# Patient Record
Sex: Male | Born: 1940
Health system: Southern US, Community
[De-identification: ages and names within clinical notes are randomized; demographics above are authoritative.]

## PROBLEM LIST (undated history)

## (undated) DIAGNOSIS — I10 Essential (primary) hypertension: Secondary | ICD-10-CM

## (undated) DIAGNOSIS — I739 Peripheral vascular disease, unspecified: Secondary | ICD-10-CM

## (undated) DIAGNOSIS — E785 Hyperlipidemia, unspecified: Secondary | ICD-10-CM

## (undated) HISTORY — DX: Hyperlipidemia, unspecified: E78.5

## (undated) HISTORY — DX: Peripheral vascular disease, unspecified: I73.9

## (undated) HISTORY — PX: VASECTOMY: SHX75

## (undated) HISTORY — PX: HERNIA REPAIR: SHX51

## (undated) HISTORY — DX: Essential (primary) hypertension: I10

---

## 2010-11-02 ENCOUNTER — Encounter: Admission: RE | Admit: 2010-11-02 | Discharge: 2010-11-02 | Payer: Self-pay | Admitting: Family Medicine

## 2015-02-10 DIAGNOSIS — I10 Essential (primary) hypertension: Secondary | ICD-10-CM | POA: Diagnosis not present

## 2015-02-10 DIAGNOSIS — Z125 Encounter for screening for malignant neoplasm of prostate: Secondary | ICD-10-CM | POA: Diagnosis not present

## 2015-02-13 DIAGNOSIS — Z86010 Personal history of colonic polyps: Secondary | ICD-10-CM | POA: Diagnosis not present

## 2015-02-13 DIAGNOSIS — Z0000 Encounter for general adult medical examination without abnormal findings: Secondary | ICD-10-CM | POA: Diagnosis not present

## 2015-02-13 DIAGNOSIS — I10 Essential (primary) hypertension: Secondary | ICD-10-CM | POA: Diagnosis not present

## 2015-02-13 DIAGNOSIS — F17200 Nicotine dependence, unspecified, uncomplicated: Secondary | ICD-10-CM | POA: Diagnosis not present

## 2015-02-13 DIAGNOSIS — H47091 Other disorders of optic nerve, not elsewhere classified, right eye: Secondary | ICD-10-CM | POA: Diagnosis not present

## 2015-02-13 DIAGNOSIS — R7301 Impaired fasting glucose: Secondary | ICD-10-CM | POA: Diagnosis not present

## 2015-02-13 DIAGNOSIS — N400 Enlarged prostate without lower urinary tract symptoms: Secondary | ICD-10-CM | POA: Diagnosis not present

## 2015-02-13 DIAGNOSIS — N529 Male erectile dysfunction, unspecified: Secondary | ICD-10-CM | POA: Diagnosis not present

## 2015-02-24 DIAGNOSIS — L570 Actinic keratosis: Secondary | ICD-10-CM | POA: Diagnosis not present

## 2015-02-24 DIAGNOSIS — L259 Unspecified contact dermatitis, unspecified cause: Secondary | ICD-10-CM | POA: Diagnosis not present

## 2015-02-24 DIAGNOSIS — D225 Melanocytic nevi of trunk: Secondary | ICD-10-CM | POA: Diagnosis not present

## 2015-04-14 DIAGNOSIS — D125 Benign neoplasm of sigmoid colon: Secondary | ICD-10-CM | POA: Diagnosis not present

## 2015-04-14 DIAGNOSIS — K635 Polyp of colon: Secondary | ICD-10-CM | POA: Diagnosis not present

## 2015-04-14 DIAGNOSIS — Z86010 Personal history of colonic polyps: Secondary | ICD-10-CM | POA: Diagnosis not present

## 2015-04-14 DIAGNOSIS — Z09 Encounter for follow-up examination after completed treatment for conditions other than malignant neoplasm: Secondary | ICD-10-CM | POA: Diagnosis not present

## 2015-04-14 DIAGNOSIS — K5730 Diverticulosis of large intestine without perforation or abscess without bleeding: Secondary | ICD-10-CM | POA: Diagnosis not present

## 2015-05-26 DIAGNOSIS — H4720 Unspecified optic atrophy: Secondary | ICD-10-CM | POA: Diagnosis not present

## 2016-02-27 DIAGNOSIS — Z86010 Personal history of colonic polyps: Secondary | ICD-10-CM | POA: Diagnosis not present

## 2016-02-27 DIAGNOSIS — R7301 Impaired fasting glucose: Secondary | ICD-10-CM | POA: Diagnosis not present

## 2016-02-27 DIAGNOSIS — N400 Enlarged prostate without lower urinary tract symptoms: Secondary | ICD-10-CM | POA: Diagnosis not present

## 2016-02-27 DIAGNOSIS — Z0000 Encounter for general adult medical examination without abnormal findings: Secondary | ICD-10-CM | POA: Diagnosis not present

## 2016-02-27 DIAGNOSIS — F17200 Nicotine dependence, unspecified, uncomplicated: Secondary | ICD-10-CM | POA: Diagnosis not present

## 2016-02-27 DIAGNOSIS — I10 Essential (primary) hypertension: Secondary | ICD-10-CM | POA: Diagnosis not present

## 2016-02-27 DIAGNOSIS — H47091 Other disorders of optic nerve, not elsewhere classified, right eye: Secondary | ICD-10-CM | POA: Diagnosis not present

## 2016-02-27 DIAGNOSIS — E7800 Pure hypercholesterolemia, unspecified: Secondary | ICD-10-CM | POA: Diagnosis not present

## 2016-02-27 DIAGNOSIS — N529 Male erectile dysfunction, unspecified: Secondary | ICD-10-CM | POA: Diagnosis not present

## 2016-03-01 DIAGNOSIS — Z86010 Personal history of colonic polyps: Secondary | ICD-10-CM | POA: Diagnosis not present

## 2016-03-01 DIAGNOSIS — E7800 Pure hypercholesterolemia, unspecified: Secondary | ICD-10-CM | POA: Diagnosis not present

## 2016-03-01 DIAGNOSIS — R7301 Impaired fasting glucose: Secondary | ICD-10-CM | POA: Diagnosis not present

## 2016-03-01 DIAGNOSIS — Z0000 Encounter for general adult medical examination without abnormal findings: Secondary | ICD-10-CM | POA: Diagnosis not present

## 2016-03-01 DIAGNOSIS — N400 Enlarged prostate without lower urinary tract symptoms: Secondary | ICD-10-CM | POA: Diagnosis not present

## 2016-03-01 DIAGNOSIS — Z125 Encounter for screening for malignant neoplasm of prostate: Secondary | ICD-10-CM | POA: Diagnosis not present

## 2016-03-01 DIAGNOSIS — H47091 Other disorders of optic nerve, not elsewhere classified, right eye: Secondary | ICD-10-CM | POA: Diagnosis not present

## 2016-03-01 DIAGNOSIS — I10 Essential (primary) hypertension: Secondary | ICD-10-CM | POA: Diagnosis not present

## 2016-03-01 DIAGNOSIS — N529 Male erectile dysfunction, unspecified: Secondary | ICD-10-CM | POA: Diagnosis not present

## 2016-07-05 DIAGNOSIS — H2513 Age-related nuclear cataract, bilateral: Secondary | ICD-10-CM | POA: Diagnosis not present

## 2016-07-05 DIAGNOSIS — Z0100 Encounter for examination of eyes and vision without abnormal findings: Secondary | ICD-10-CM | POA: Diagnosis not present

## 2016-07-05 DIAGNOSIS — H35372 Puckering of macula, left eye: Secondary | ICD-10-CM | POA: Diagnosis not present

## 2017-03-25 DIAGNOSIS — Z86010 Personal history of colonic polyps: Secondary | ICD-10-CM | POA: Diagnosis not present

## 2017-03-25 DIAGNOSIS — E7800 Pure hypercholesterolemia, unspecified: Secondary | ICD-10-CM | POA: Diagnosis not present

## 2017-03-25 DIAGNOSIS — Z0000 Encounter for general adult medical examination without abnormal findings: Secondary | ICD-10-CM | POA: Diagnosis not present

## 2017-03-25 DIAGNOSIS — Z125 Encounter for screening for malignant neoplasm of prostate: Secondary | ICD-10-CM | POA: Diagnosis not present

## 2017-03-25 DIAGNOSIS — F17200 Nicotine dependence, unspecified, uncomplicated: Secondary | ICD-10-CM | POA: Diagnosis not present

## 2017-03-25 DIAGNOSIS — L309 Dermatitis, unspecified: Secondary | ICD-10-CM | POA: Diagnosis not present

## 2017-03-25 DIAGNOSIS — I10 Essential (primary) hypertension: Secondary | ICD-10-CM | POA: Diagnosis not present

## 2017-03-25 DIAGNOSIS — R7301 Impaired fasting glucose: Secondary | ICD-10-CM | POA: Diagnosis not present

## 2017-03-25 DIAGNOSIS — N529 Male erectile dysfunction, unspecified: Secondary | ICD-10-CM | POA: Diagnosis not present

## 2017-03-30 ENCOUNTER — Other Ambulatory Visit: Payer: Self-pay | Admitting: Acute Care

## 2017-03-30 DIAGNOSIS — F17210 Nicotine dependence, cigarettes, uncomplicated: Secondary | ICD-10-CM

## 2017-04-18 ENCOUNTER — Encounter: Payer: Self-pay | Admitting: Acute Care

## 2017-04-18 ENCOUNTER — Ambulatory Visit (INDEPENDENT_AMBULATORY_CARE_PROVIDER_SITE_OTHER)
Admission: RE | Admit: 2017-04-18 | Discharge: 2017-04-18 | Disposition: A | Discharge status: Home / Self Care | Payer: Medicare HMO | Source: Ambulatory Visit | Attending: Acute Care | Admitting: Acute Care

## 2017-04-18 ENCOUNTER — Ambulatory Visit (INDEPENDENT_AMBULATORY_CARE_PROVIDER_SITE_OTHER): Payer: Medicare HMO | Admitting: Acute Care

## 2017-04-18 DIAGNOSIS — F17210 Nicotine dependence, cigarettes, uncomplicated: Secondary | ICD-10-CM

## 2017-04-18 DIAGNOSIS — Z87891 Personal history of nicotine dependence: Secondary | ICD-10-CM

## 2017-04-18 NOTE — Progress Notes (Signed)
Shared Decision Making Visit Lung Cancer Screening Program [phone])   Eligibility:  Age 76 y.o.  Pack Years Smoking History Calculation 54-pack-year smoking history (# packs/per year x # years smoked)  Recent History of coughing up blood  no  Unexplained weight loss? no ( >Than 15 pounds within the last 6 months )  Prior History Lung / other cancer no (Diagnosis within the last 5 years already requiring surveillance chest CT Scans).  Smoking Status Current Smoker  Former Smokers: Years since quit: Not applicable  Quit Date: Not applicable  Visit Components:  Discussion included one or more decision making aids. yes  Discussion included risk/benefits of screening. yes  Discussion included potential follow up diagnostic testing for abnormal scans. yes  Discussion included meaning and risk of over diagnosis. yes  Discussion included meaning and risk of False Positives. yes  Discussion included meaning of total radiation exposure. yes  Counseling Included:  Importance of adherence to annual lung cancer LDCT screening. yes  Impact of comorbidities on ability to participate in the program. yes  Ability and willingness to under diagnostic treatment. yes  Smoking Cessation Counseling:  Current Smokers:   Discussed importance of smoking cessation. yes  Information about tobacco cessation classes and interventions provided to patient. yes  Patient provided with "ticket" for LDCT Scan. yes  Symptomatic Patient. no  Counseling not applicable  Diagnosis Code: Tobacco Use Z72.0  Asymptomatic Patient yes  Counseling (Intermediate counseling: > three minutes counseling) A2130  Former Smokers:   Discussed the importance of maintaining cigarette abstinence. yes  Diagnosis Code: Personal History of Nicotine Dependence. Q65.784  Information about tobacco cessation classes and interventions provided to patient. Yes  Patient provided with "ticket" for LDCT Scan.  yes  Written Order for Lung Cancer Screening with LDCT placed in Epic. Yes (CT Chest Lung Cancer Screening Low Dose W/O CM) [id number] Z12.2-Screening of respiratory organs Z87.891-Personal history of nicotine dependence  I have spent 25 minutes of face to face time with Mr. Nuon and his wife discussing the risks and benefits of lung cancer screening. We viewed a power point together that explained in detail the above noted topics. We paused at intervals to allow for questions to be asked and answered to ensure understanding.We discussed that the single most powerful action that he can take to decrease his risk of developing lung cancer is to quit smoking. We discussed whether or not he is ready to commit to setting a quit date. He is currently not ready to set a quit date. We discussed options for tools to aid in quitting smoking including nicotine replacement therapy, non-nicotine medications, support groups, Quit Smart classes, and behavior modification. We discussed that often times setting smaller, more achievable goals, such as eliminating 1 cigarette a day for a week and then 2 cigarettes a day for a week can be helpful in slowly decreasing the number of cigarettes smoked. This allows for a sense of accomplishment as well as providing a clinical benefit. I gave Mr. Jasinski the " Be Stronger Than Your Excuses" card with contact information for community resources, classes, free nicotine replacement therapy, and access to mobile apps, text messaging, and on-line smoking cessation help. I have also given Mr. Hardgrove my card and contact information in the event he needs to contact me. We discussed the time and location of the scan, and that either Doroteo Glassman RN or I will call with the results within 24-48 hours of receiving them. I have offered him  a copy  of the power point we viewed  as a resource in the event they need reinforcement of the concepts we discussed today in the office. The patient verbalized  understanding of all of  the above and had no further questions upon leaving the office. They have my contact information in the event they have any further questions.  I spent for minutes counseling on smoking cessation and the health risks of continued tobacco abuse.  I explained to the patient that there has been a high incidence of coronary artery disease noted on these exams. I explained that this is a non-gated exam therefore degree or severity cannot be determined. This patient is not currently on statin therapy. I have asked the patient to follow-up with their PCP regarding any incidental finding of coronary artery disease and management with diet or medication as their PCP  feels is clinically indicated. The patient verbalized understanding of the above and had no further questions upon completion of the visit.       Magdalen Spatz, NP 04/18/2017

## 2017-04-22 ENCOUNTER — Other Ambulatory Visit: Payer: Self-pay | Admitting: Acute Care

## 2017-04-22 DIAGNOSIS — F17210 Nicotine dependence, cigarettes, uncomplicated: Secondary | ICD-10-CM

## 2017-07-11 DIAGNOSIS — H35372 Puckering of macula, left eye: Secondary | ICD-10-CM | POA: Diagnosis not present

## 2017-07-11 DIAGNOSIS — H524 Presbyopia: Secondary | ICD-10-CM | POA: Diagnosis not present

## 2017-07-11 DIAGNOSIS — H2513 Age-related nuclear cataract, bilateral: Secondary | ICD-10-CM | POA: Diagnosis not present

## 2018-04-28 ENCOUNTER — Ambulatory Visit (INDEPENDENT_AMBULATORY_CARE_PROVIDER_SITE_OTHER)
Admission: RE | Admit: 2018-04-28 | Discharge: 2018-04-28 | Disposition: A | Discharge status: Home / Self Care | Payer: Medicare HMO | Source: Ambulatory Visit | Attending: Acute Care | Admitting: Acute Care

## 2018-04-28 DIAGNOSIS — Z86010 Personal history of colonic polyps: Secondary | ICD-10-CM | POA: Diagnosis not present

## 2018-04-28 DIAGNOSIS — F17200 Nicotine dependence, unspecified, uncomplicated: Secondary | ICD-10-CM | POA: Diagnosis not present

## 2018-04-28 DIAGNOSIS — F17210 Nicotine dependence, cigarettes, uncomplicated: Secondary | ICD-10-CM

## 2018-04-28 DIAGNOSIS — Z125 Encounter for screening for malignant neoplasm of prostate: Secondary | ICD-10-CM | POA: Diagnosis not present

## 2018-04-28 DIAGNOSIS — E7800 Pure hypercholesterolemia, unspecified: Secondary | ICD-10-CM | POA: Diagnosis not present

## 2018-04-28 DIAGNOSIS — Z0000 Encounter for general adult medical examination without abnormal findings: Secondary | ICD-10-CM | POA: Diagnosis not present

## 2018-04-28 DIAGNOSIS — R7301 Impaired fasting glucose: Secondary | ICD-10-CM | POA: Diagnosis not present

## 2018-04-28 DIAGNOSIS — H47091 Other disorders of optic nerve, not elsewhere classified, right eye: Secondary | ICD-10-CM | POA: Diagnosis not present

## 2018-04-28 DIAGNOSIS — R972 Elevated prostate specific antigen [PSA]: Secondary | ICD-10-CM | POA: Diagnosis not present

## 2018-04-28 DIAGNOSIS — I10 Essential (primary) hypertension: Secondary | ICD-10-CM | POA: Diagnosis not present

## 2018-05-02 DIAGNOSIS — M25561 Pain in right knee: Secondary | ICD-10-CM | POA: Diagnosis not present

## 2018-05-02 DIAGNOSIS — W19XXXA Unspecified fall, initial encounter: Secondary | ICD-10-CM | POA: Diagnosis not present

## 2018-05-02 DIAGNOSIS — S8001XA Contusion of right knee, initial encounter: Secondary | ICD-10-CM | POA: Diagnosis not present

## 2018-05-03 ENCOUNTER — Other Ambulatory Visit: Payer: Self-pay | Admitting: Acute Care

## 2018-05-03 DIAGNOSIS — F17210 Nicotine dependence, cigarettes, uncomplicated: Secondary | ICD-10-CM

## 2018-05-03 DIAGNOSIS — Z122 Encounter for screening for malignant neoplasm of respiratory organs: Secondary | ICD-10-CM

## 2018-07-17 DIAGNOSIS — H35372 Puckering of macula, left eye: Secondary | ICD-10-CM | POA: Diagnosis not present

## 2018-07-17 DIAGNOSIS — H524 Presbyopia: Secondary | ICD-10-CM | POA: Diagnosis not present

## 2018-07-17 DIAGNOSIS — H2513 Age-related nuclear cataract, bilateral: Secondary | ICD-10-CM | POA: Diagnosis not present

## 2018-11-06 DIAGNOSIS — R972 Elevated prostate specific antigen [PSA]: Secondary | ICD-10-CM | POA: Diagnosis not present

## 2019-07-27 DIAGNOSIS — R8290 Unspecified abnormal findings in urine: Secondary | ICD-10-CM | POA: Diagnosis not present

## 2019-07-27 DIAGNOSIS — J439 Emphysema, unspecified: Secondary | ICD-10-CM | POA: Diagnosis not present

## 2019-07-27 DIAGNOSIS — H47091 Other disorders of optic nerve, not elsewhere classified, right eye: Secondary | ICD-10-CM | POA: Diagnosis not present

## 2019-07-27 DIAGNOSIS — N400 Enlarged prostate without lower urinary tract symptoms: Secondary | ICD-10-CM | POA: Diagnosis not present

## 2019-07-27 DIAGNOSIS — I10 Essential (primary) hypertension: Secondary | ICD-10-CM | POA: Diagnosis not present

## 2019-07-27 DIAGNOSIS — F17200 Nicotine dependence, unspecified, uncomplicated: Secondary | ICD-10-CM | POA: Diagnosis not present

## 2019-07-27 DIAGNOSIS — E7800 Pure hypercholesterolemia, unspecified: Secondary | ICD-10-CM | POA: Diagnosis not present

## 2019-07-27 DIAGNOSIS — R7301 Impaired fasting glucose: Secondary | ICD-10-CM | POA: Diagnosis not present

## 2019-07-27 DIAGNOSIS — Z13220 Encounter for screening for lipoid disorders: Secondary | ICD-10-CM | POA: Diagnosis not present

## 2019-07-27 DIAGNOSIS — Z0000 Encounter for general adult medical examination without abnormal findings: Secondary | ICD-10-CM | POA: Diagnosis not present

## 2019-09-20 DIAGNOSIS — H2513 Age-related nuclear cataract, bilateral: Secondary | ICD-10-CM | POA: Diagnosis not present

## 2019-09-20 DIAGNOSIS — H35372 Puckering of macula, left eye: Secondary | ICD-10-CM | POA: Diagnosis not present

## 2019-09-20 DIAGNOSIS — H524 Presbyopia: Secondary | ICD-10-CM | POA: Diagnosis not present

## 2019-11-02 DIAGNOSIS — Z23 Encounter for immunization: Secondary | ICD-10-CM | POA: Diagnosis not present

## 2019-11-02 DIAGNOSIS — R799 Abnormal finding of blood chemistry, unspecified: Secondary | ICD-10-CM | POA: Diagnosis not present

## 2020-02-02 ENCOUNTER — Ambulatory Visit: Payer: Medicare HMO | Attending: Internal Medicine

## 2020-02-02 DIAGNOSIS — Z23 Encounter for immunization: Secondary | ICD-10-CM | POA: Insufficient documentation

## 2020-02-02 NOTE — Progress Notes (Signed)
   Covid-19 Vaccination Clinic  Name:  Canden Lownes    MRN: [id number] DOB: [birth year]  02/02/2020  Mr. Seavers was observed post Covid-19 immunization for 15 minutes without incidence. He was provided with Vaccine Information Sheet and instruction to access the V-Safe system.   Mr. Staver was instructed to call 911 with any severe reactions post vaccine: Marland Kitchen Difficulty breathing  . Swelling of your face and throat  . A fast heartbeat  . A bad rash all over your body  . Dizziness and weakness    Immunizations Administered    Name Date Dose VIS Date Route   Pfizer COVID-19 Vaccine 02/02/2020  1:56 PM 0.3 mL 11/23/2019 Intramuscular   Manufacturer: [place]   Lot: [id number]   [place]: [id number]

## 2020-02-26 ENCOUNTER — Ambulatory Visit: Payer: Medicare HMO | Attending: Internal Medicine

## 2020-02-26 DIAGNOSIS — Z23 Encounter for immunization: Secondary | ICD-10-CM

## 2020-02-26 NOTE — Progress Notes (Signed)
   Covid-19 Vaccination Clinic  Name:  Sean Sanders    MRN: [id number] DOB: 04/18/1941  02/26/2020  [name] was observed post Covid-19 immunization for 15 minutes without incident. He was provided with Vaccine Information Sheet and instruction to access the V-Safe system.   [name] was instructed to call 911 with any severe reactions post vaccine: Marland Kitchen Difficulty breathing  . Swelling of face and throat  . A fast heartbeat  . A bad rash all over body  . Dizziness and weakness   Immunizations Administered    Name Date Dose VIS Date Route   Pfizer COVID-19 Vaccine 02/26/2020  1:02 PM 0.3 mL 11/23/2019 Intramuscular   Manufacturer: [place]   Lot: [id number]   [place]: [id number]

## 2020-04-07 DIAGNOSIS — L03115 Cellulitis of right lower limb: Secondary | ICD-10-CM | POA: Diagnosis not present

## 2020-04-10 DIAGNOSIS — L03115 Cellulitis of right lower limb: Secondary | ICD-10-CM | POA: Diagnosis not present

## 2020-04-14 DIAGNOSIS — L0390 Cellulitis, unspecified: Secondary | ICD-10-CM | POA: Diagnosis not present

## 2020-04-14 DIAGNOSIS — Z09 Encounter for follow-up examination after completed treatment for conditions other than malignant neoplasm: Secondary | ICD-10-CM | POA: Diagnosis not present

## 2020-04-21 DIAGNOSIS — L02415 Cutaneous abscess of right lower limb: Secondary | ICD-10-CM | POA: Diagnosis not present

## 2020-04-21 DIAGNOSIS — L03115 Cellulitis of right lower limb: Secondary | ICD-10-CM | POA: Diagnosis not present

## 2020-04-25 DIAGNOSIS — M79604 Pain in right leg: Secondary | ICD-10-CM | POA: Diagnosis not present

## 2020-04-25 DIAGNOSIS — L03115 Cellulitis of right lower limb: Secondary | ICD-10-CM | POA: Diagnosis not present

## 2020-04-25 DIAGNOSIS — L03119 Cellulitis of unspecified part of limb: Secondary | ICD-10-CM | POA: Insufficient documentation

## 2020-05-05 DIAGNOSIS — M79604 Pain in right leg: Secondary | ICD-10-CM | POA: Diagnosis not present

## 2020-05-09 DIAGNOSIS — L03115 Cellulitis of right lower limb: Secondary | ICD-10-CM | POA: Diagnosis not present

## 2020-05-22 DIAGNOSIS — L03119 Cellulitis of unspecified part of limb: Secondary | ICD-10-CM | POA: Diagnosis not present

## 2020-08-14 DIAGNOSIS — E7800 Pure hypercholesterolemia, unspecified: Secondary | ICD-10-CM | POA: Diagnosis not present

## 2020-08-14 DIAGNOSIS — H47091 Other disorders of optic nerve, not elsewhere classified, right eye: Secondary | ICD-10-CM | POA: Diagnosis not present

## 2020-08-14 DIAGNOSIS — F17200 Nicotine dependence, unspecified, uncomplicated: Secondary | ICD-10-CM | POA: Diagnosis not present

## 2020-08-14 DIAGNOSIS — F1010 Alcohol abuse, uncomplicated: Secondary | ICD-10-CM | POA: Diagnosis not present

## 2020-08-14 DIAGNOSIS — Z0000 Encounter for general adult medical examination without abnormal findings: Secondary | ICD-10-CM | POA: Diagnosis not present

## 2020-08-14 DIAGNOSIS — I10 Essential (primary) hypertension: Secondary | ICD-10-CM | POA: Diagnosis not present

## 2020-08-14 DIAGNOSIS — N400 Enlarged prostate without lower urinary tract symptoms: Secondary | ICD-10-CM | POA: Diagnosis not present

## 2020-08-14 DIAGNOSIS — E871 Hypo-osmolality and hyponatremia: Secondary | ICD-10-CM | POA: Diagnosis not present

## 2020-08-14 DIAGNOSIS — I2510 Atherosclerotic heart disease of native coronary artery without angina pectoris: Secondary | ICD-10-CM | POA: Diagnosis not present

## 2020-08-14 DIAGNOSIS — R7301 Impaired fasting glucose: Secondary | ICD-10-CM | POA: Diagnosis not present

## 2020-08-14 DIAGNOSIS — J438 Other emphysema: Secondary | ICD-10-CM | POA: Diagnosis not present

## 2020-08-14 DIAGNOSIS — R8290 Unspecified abnormal findings in urine: Secondary | ICD-10-CM | POA: Diagnosis not present

## 2020-09-14 NOTE — Progress Notes (Signed)
Cardiology Office Note:    Date:  09/16/2020   ID:  Sean Sanders, DOB 11-Apr-1941, MRN [id number]  PCP:  Hulan Fess, MD  Cardiologist:  No primary care provider on file.  Electrophysiologist:  None   Referring MD: Hulan Fess, MD   Chief Complaint  Patient presents with  . Coronary Artery Disease    History of Present Illness:    Sean Sanders is a 79 y.o. male with a hx of hypertension, tobacco use who is referred by Dr. Rex Kras for evaluation of coronary calcification seen on CT. Three-vessel coronary artery calcification seen on CT chest on 04/29/2018.  He denies any chest pain or shortness of breath.  Reports he walks 2 times per week for 30 minutes, denies any exertional symptoms.  He denies any lightheadedness, syncope, palpitations, or lower extremity edema.  He smokes 1 pack/day, started at age 26.  No history of heart disease in his immediate family.  No past medical history on file.  Current Medications: Current Meds  Medication Sig  . amLODipine (NORVASC) 5 MG tablet   . Ascorbic Acid (VITAMIN C WITH ROSE HIPS) 500 MG tablet Take 500 mg by mouth daily.  Marland Kitchen aspirin EC 81 MG tablet Take 81 mg by mouth daily. Swallow whole.  . benazepril (LOTENSIN) 10 MG tablet benazepril 10 mg tablet  . cholecalciferol (VITAMIN D3) 25 MCG (1000 UNIT) tablet Take 1,000 Units by mouth daily.  . cyanocobalamin 100 MCG tablet Take 100 mcg by mouth daily.  . Multiple Vitamin (MULTIVITAMIN) tablet Take 1 tablet by mouth daily.  . Omega-3 Fatty Acids (FISH OIL) 1000 MG CAPS Take by mouth.  . sulfamethoxazole-trimethoprim (BACTRIM DS) 800-160 MG tablet Take 1 tablet by mouth 2 (two) times daily.     Allergies:   Patient has no known allergies.   Social History   Socioeconomic History  . Marital status: Married    Spouse name: Not on file  . Number of children: Not on file  . Years of education: Not on file  . Highest education level: Not on file  Occupational History  . Not on file   Tobacco Use  . Smoking status: Current Every Day Smoker    Packs/day: 1.00    Years: 54.00    Pack years: 54.00    Types: Cigarettes    Start date: 1964  . Smokeless tobacco: Never Used  Substance and Sexual Activity  . Alcohol use: Not on file  . Drug use: Not on file  . Sexual activity: Not on file  Other Topics Concern  . Not on file  Social History Narrative  . Not on file   Social Determinants of Health   Financial Resource Strain:   . Difficulty of Paying Living Expenses: Not on file  Food Insecurity:   . Worried About Charity fundraiser in the Last Year: Not on file  . Ran Out of Food in the Last Year: Not on file  Transportation Needs:   . Lack of Transportation (Medical): Not on file  . Lack of Transportation (Non-Medical): Not on file  Physical Activity:   . Days of Exercise per Week: Not on file  . Minutes of Exercise per Session: Not on file  Stress:   . Feeling of Stress : Not on file  Social Connections:   . Frequency of Communication with Friends and Family: Not on file  . Frequency of Social Gatherings with Friends and Family: Not on file  . Attends Religious Services: Not on  file  . Active Member of Clubs or Organizations: Not on file  . Attends Archivist Meetings: Not on file  . Marital Status: Not on file     Family History: The patient's family history is not on file.  ROS:   Please see the history of present illness.     All other systems reviewed and are negative.  EKGs/Labs/Other Studies Reviewed:    The following studies were reviewed today:   EKG:  EKG is  ordered today.  The ekg ordered today demonstrates normal sinus rhythm, PAC, right axis deviation, poor R wave progression  Recent Labs: No results found for requested labs within last 8760 hours.  Recent Lipid Panel No results found for: CHOL, TRIG, HDL, CHOLHDL, VLDL, LDLCALC, LDLDIRECT  Physical Exam:    VS:  BP 120/66   Pulse 97   Ht 5\' 3"  (1.6 m)   Wt 158  lb (71.7 kg)   SpO2 98%   BMI 27.99 kg/m     Wt Readings from Last 3 Encounters:  09/16/20 158 lb (71.7 kg)     GEN:  Well nourished, well developed in no acute distress HEENT: Normal NECK: No JVD; No carotid bruits LYMPHATICS: No lymphadenopathy CARDIAC: RRR, no murmurs, rubs, gallops RESPIRATORY:  Clear to auscultation without rales, wheezing or rhonchi  ABDOMEN: Soft, non-tender, non-distended MUSCULOSKELETAL:  No edema; No deformity  SKIN: Warm and dry NEUROLOGIC:  Alert and oriented x 3 PSYCHIATRIC:  Normal affect   ASSESSMENT:    1. Coronary artery calcification seen on CT scan   2. Hyperlipidemia, unspecified hyperlipidemia type   3. Essential hypertension   4. Tobacco use    PLAN:    CAD: Three-vessel coronary artery calcification seen on CT chest on 04/29/2018.  Denies any chest pain or dyspnea. -Given patient is asymptomatic, will hold off on stress testing at this time.  Advised to let us know if develops any symptoms, as will plan for ischemia evaluation -Start rosuvastatin 10 mg daily  Hypertension: On amlodipine 5 mg daily, benazepril 10 mg daily.  Appears controlled.  Hyperlipidemia: LDL 110 on 08/14/2020.  10-year ASCVD risk score 32%.  Will start rosuvastatin 10 mg daily as above  Tobacco use: Patient counseled risk of tobacco use and cessation strongly encouraged.  Offered to have care guide work with him to help him quit smoking, but states he is not interested at this time.  RTC in 3 months  Medication Adjustments/Labs and Tests Ordered: Current medicines are reviewed at length with the patient today.  Concerns regarding medicines are outlined above.  Orders Placed This Encounter  Procedures  . EKG 12-Lead   Meds ordered this encounter  Medications  . rosuvastatin (CRESTOR) 10 MG tablet    Sig: Take 1 tablet (10 mg total) by mouth daily.    Dispense:  90 tablet    Refill:  3    Patient Instructions  Medication Instructions:  START  rosuvastatin (Crestor) 10 mg daily  *If you need a refill on your cardiac medications before your next appointment, please call your pharmacy*  Follow-Up: At [facility], you and your health needs are our priority.  As part of our continuing mission to provide you with exceptional heart care, we have created designated Provider Care Teams.  These Care Teams include your primary Cardiologist (physician) and Advanced Practice Providers (APPs -  Physician Assistants and Nurse Practitioners) who all work together to provide you with the care you need, when you need it.  We recommend signing up for the patient portal called "MyChart".  Sign up information is provided on this After Visit Summary.  MyChart is used to connect with patients for Virtual Visits (Telemedicine).  Patients are able to view lab/test results, encounter notes, upcoming appointments, etc.  Non-urgent messages can be sent to your provider as well.   To learn more about what you can do with MyChart, go to [email or web address].    Your next appointment:   3 month(s)  The format for your next appointment:   In Person  Provider:   Oswaldo Milian, MD       Signed, Donato Heinz, MD  09/16/2020 11:06 AM    [place]

## 2020-09-16 ENCOUNTER — Ambulatory Visit (INDEPENDENT_AMBULATORY_CARE_PROVIDER_SITE_OTHER): Payer: Medicare HMO | Admitting: Cardiology

## 2020-09-16 ENCOUNTER — Other Ambulatory Visit: Payer: Self-pay

## 2020-09-16 ENCOUNTER — Encounter: Payer: Self-pay | Admitting: Cardiology

## 2020-09-16 VITALS — BP 120/66 | HR 97 | Ht 63.0 in | Wt 158.0 lb

## 2020-09-16 DIAGNOSIS — I2510 Atherosclerotic heart disease of native coronary artery without angina pectoris: Secondary | ICD-10-CM

## 2020-09-16 DIAGNOSIS — E785 Hyperlipidemia, unspecified: Secondary | ICD-10-CM

## 2020-09-16 DIAGNOSIS — I10 Essential (primary) hypertension: Secondary | ICD-10-CM | POA: Diagnosis not present

## 2020-09-16 DIAGNOSIS — Z720 Tobacco use: Secondary | ICD-10-CM

## 2020-09-16 MED ORDER — ROSUVASTATIN CALCIUM 10 MG PO TABS: 10.0000 mg | ORAL_TABLET | Freq: Every day | ORAL | 3 refills | Status: DC

## 2020-09-16 NOTE — Patient Instructions (Signed)
Medication Instructions:  START rosuvastatin (Crestor) 10 mg daily  *If you need a refill on your cardiac medications before your next appointment, please call your pharmacy*  Follow-Up: At [facility], you and your health needs are our priority.  As part of our continuing mission to provide you with exceptional heart care, we have created designated Provider Care Teams.  These Care Teams include your primary Cardiologist (physician) and Advanced Practice Providers (APPs -  Physician Assistants and Nurse Practitioners) who all work together to provide you with the care you need, when you need it.  We recommend signing up for the patient portal called "MyChart".  Sign up information is provided on this After Visit Summary.  MyChart is used to connect with patients for Virtual Visits (Telemedicine).  Patients are able to view lab/test results, encounter notes, upcoming appointments, etc.  Non-urgent messages can be sent to your provider as well.   To learn more about what you can do with MyChart, go to [email or web address].    Your next appointment:   3 month(s)  The format for your next appointment:   In Person  Provider:   Oswaldo Milian, MD

## 2020-09-18 ENCOUNTER — Other Ambulatory Visit: Payer: Self-pay | Admitting: *Deleted

## 2020-09-18 DIAGNOSIS — Z87891 Personal history of nicotine dependence: Secondary | ICD-10-CM

## 2020-09-18 DIAGNOSIS — F17210 Nicotine dependence, cigarettes, uncomplicated: Secondary | ICD-10-CM

## 2020-10-10 ENCOUNTER — Ambulatory Visit (INDEPENDENT_AMBULATORY_CARE_PROVIDER_SITE_OTHER)
Admission: RE | Admit: 2020-10-10 | Discharge: 2020-10-10 | Disposition: A | Discharge status: Home / Self Care | Payer: Medicare HMO | Source: Ambulatory Visit | Attending: Family Medicine | Admitting: Family Medicine

## 2020-10-10 ENCOUNTER — Other Ambulatory Visit: Payer: Self-pay

## 2020-10-10 DIAGNOSIS — N281 Cyst of kidney, acquired: Secondary | ICD-10-CM

## 2020-10-10 DIAGNOSIS — I700 Atherosclerosis of aorta: Secondary | ICD-10-CM | POA: Diagnosis not present

## 2020-10-10 DIAGNOSIS — Z87891 Personal history of nicotine dependence: Secondary | ICD-10-CM | POA: Diagnosis not present

## 2020-10-10 DIAGNOSIS — M4714 Other spondylosis with myelopathy, thoracic region: Secondary | ICD-10-CM

## 2020-10-10 DIAGNOSIS — J439 Emphysema, unspecified: Secondary | ICD-10-CM | POA: Diagnosis not present

## 2020-10-10 DIAGNOSIS — F17210 Nicotine dependence, cigarettes, uncomplicated: Secondary | ICD-10-CM

## 2020-10-13 NOTE — Progress Notes (Signed)
Please call patient and let them  know their  low dose Ct was read as a Lung RADS 2: nodules that are benign in appearance and behavior with a very low likelihood of becoming a clinically active cancer due to size or lack of growth. Recommendation per radiology is for a repeat LDCT in 12 months. .Please let them  know we will order and schedule their  annual screening scan for 09/2021. Please let them  know there was notation of CAD on their  scan.  Please remind the patient  that this is a non-gated exam therefore degree or severity of disease  cannot be determined. Please have them  follow up with their PCP regarding potential risk factor modification, dietary therapy or pharmacologic therapy if clinically indicated. Pt.  is  currently on statin therapy. Please place order for annual  screening scan for  09/2021 and fax results to PCP. Thanks so much.  Pt. Is followed by cards.

## 2020-10-16 ENCOUNTER — Other Ambulatory Visit: Payer: Self-pay | Admitting: *Deleted

## 2020-10-16 DIAGNOSIS — H524 Presbyopia: Secondary | ICD-10-CM | POA: Diagnosis not present

## 2020-10-16 DIAGNOSIS — F17210 Nicotine dependence, cigarettes, uncomplicated: Secondary | ICD-10-CM

## 2020-10-16 DIAGNOSIS — Z87891 Personal history of nicotine dependence: Secondary | ICD-10-CM

## 2020-10-16 DIAGNOSIS — H5203 Hypermetropia, bilateral: Secondary | ICD-10-CM | POA: Diagnosis not present

## 2020-10-16 DIAGNOSIS — H2513 Age-related nuclear cataract, bilateral: Secondary | ICD-10-CM | POA: Diagnosis not present

## 2020-10-16 DIAGNOSIS — H35372 Puckering of macula, left eye: Secondary | ICD-10-CM | POA: Diagnosis not present

## 2020-12-14 NOTE — Progress Notes (Signed)
Cardiology Office Note:    Date:  12/18/2020   ID:  Sean Sanders, DOB Nov 27, 1941, MRN [id number]  PCP:  Catha Gosselin, MD  Cardiologist:  No primary care provider on file.  Electrophysiologist:  None   Referring MD: Catha Gosselin, MD   Chief Complaint  Patient presents with  . Coronary Artery Disease    History of Present Illness:    Sean Sanders is a 80 y.o. male with a hx of hypertension, tobacco use who presents for follow-up.  He was referred by Dr. Clarene Duke for evaluation of coronary calcification seen on CT, initially seen on 09/16/2020.  Three-vessel coronary artery calcification seen on CT chest on 04/29/2018.  He denies any chest pain or shortness of breath.  Reports he walks 2 times per week for 30 minutes, denies any exertional symptoms.  He denies any lightheadedness, syncope, palpitations, or lower extremity edema.  He smokes 1 pack/day, started at age 58.  No history of heart disease in his immediate family.  Since last clinic visit, he reports that he has been doing well.  Denies any chest pain or dyspnea.  Reports walks twice per week for about 30 minutes, denies any exertional symptoms.  Denies any lightheadedness, syncope, palpitations, lower extremity edema.  Continues to smoke 1 pack/day.    No past medical history on file.  Current Medications: Current Meds  Medication Sig  . amLODipine (NORVASC) 5 MG tablet   . Ascorbic Acid (VITAMIN C WITH ROSE HIPS) 500 MG tablet Take 500 mg by mouth daily.  Marland Kitchen aspirin EC 81 MG tablet Take 81 mg by mouth daily. Swallow whole.  . benazepril (LOTENSIN) 10 MG tablet benazepril 10 mg tablet  . cholecalciferol (VITAMIN D3) 25 MCG (1000 UNIT) tablet Take 1,000 Units by mouth daily.  . cyanocobalamin 100 MCG tablet Take 100 mcg by mouth daily.  . Multiple Vitamin (MULTIVITAMIN) tablet Take 1 tablet by mouth daily.  . Omega-3 Fatty Acids (FISH OIL) 1000 MG CAPS Take by mouth.  . rosuvastatin (CRESTOR) 10 MG tablet Take 1 tablet (10 mg  total) by mouth daily.  Marland Kitchen sulfamethoxazole-trimethoprim (BACTRIM DS) 800-160 MG tablet Take 1 tablet by mouth 2 (two) times daily.     Allergies:   Patient has no known allergies.   Social History   Socioeconomic History  . Marital status: Married    Spouse name: Not on file  . Number of children: Not on file  . Years of education: Not on file  . Highest education level: Not on file  Occupational History  . Not on file  Tobacco Use  . Smoking status: Current Every Day Smoker    Packs/day: 1.00    Years: 54.00    Pack years: 54.00    Types: Cigarettes    Start date: 1964  . Smokeless tobacco: Never Used  Substance and Sexual Activity  . Alcohol use: Not on file  . Drug use: Not on file  . Sexual activity: Not on file  Other Topics Concern  . Not on file  Social History Narrative  . Not on file   Social Determinants of Health   Financial Resource Strain: Not on file  Food Insecurity: Not on file  Transportation Needs: Not on file  Physical Activity: Not on file  Stress: Not on file  Social Connections: Not on file     Family History: The patient's family history is not on file.  ROS:   Please see the history of present illness.  All other systems reviewed and are negative.  EKGs/Labs/Other Studies Reviewed:    The following studies were reviewed today:   EKG:  EKG is ordered today.  The ekg ordered today demonstrates normal sinus rhythm, rate 86, no ST abnormalities  Recent Labs: No results found for requested labs within last 8760 hours.  Recent Lipid Panel No results found for: CHOL, TRIG, HDL, CHOLHDL, VLDL, LDLCALC, LDLDIRECT  Physical Exam:    VS:  BP 138/68   Pulse 86   Ht 5' 4.5" (1.638 m)   Wt 166 lb 3.2 oz (75.4 kg)   SpO2 96%   BMI 28.09 kg/m     Wt Readings from Last 3 Encounters:  12/18/20 166 lb 3.2 oz (75.4 kg)  09/16/20 158 lb (71.7 kg)     GEN:  Well nourished, well developed in no acute distress HEENT: Normal NECK: No  JVD; No carotid bruits CARDIAC: RRR, 2 out of 6 systolic murmur RESPIRATORY:  Clear to auscultation without rales, wheezing or rhonchi  ABDOMEN: Soft, non-tender, non-distended MUSCULOSKELETAL:  No edema; No deformity  SKIN: Warm and dry NEUROLOGIC:  Alert and oriented x 3 PSYCHIATRIC:  Normal affect   ASSESSMENT:    1. Coronary artery calcification seen on CT scan   2. Essential hypertension   3. Hyperlipidemia, unspecified hyperlipidemia type   4. Tobacco use   5. Heart murmur    PLAN:    CAD: Three-vessel coronary artery calcification seen on CT chest on 04/29/2018.  Denies any chest pain or dyspnea. -Given patient is asymptomatic but does have significant coronary calcifications, discussed obtaining stress test to rule out obstructive CAD.  He declines at this time.  Advised to let us know if develops any symptoms, as will plan for ischemia evaluation -Continue rosuvastatin 10 mg daily, will check lipid panel -Continue aspirin 81 mg daily  Heart murmur: will check echocardiogram  Hypertension: On amlodipine 5 mg daily, benazepril 10 mg daily.  Appears controlled.  Hyperlipidemia: LDL 110 on 08/14/2020.  10-year ASCVD risk score 32%.  Started on rosuvastatin 10 mg daily, will check lipid panel  Tobacco use: Patient counseled risk of tobacco use and cessation strongly encouraged.  Offered to have care guide work with him to help him quit smoking, but states he is not interested at this time.  RTC in 6 months  Medication Adjustments/Labs and Tests Ordered: Current medicines are reviewed at length with the patient today.  Concerns regarding medicines are outlined above.  Orders Placed This Encounter  Procedures  . Lipid panel  . EKG 12-Lead  . ECHOCARDIOGRAM COMPLETE   No orders of the defined types were placed in this encounter.   Patient Instructions  Medication Instructions:  Your physician recommends that you continue on your current medications as directed. Please  refer to the Current Medication list given to you today.  *If you need a refill on your cardiac medications before your next appointment, please call your pharmacy*   Lab Work: Lipid today  If you have labs (blood work) drawn today and your tests are completely normal, you will receive your results only by: Marland Kitchen MyChart Message (if you have MyChart) OR . A paper copy in the mail If you have any lab test that is abnormal or we need to change your treatment, we will call you to review the results.   Testing/Procedures: Your physician has requested that you have an echocardiogram. Echocardiography is a painless test that uses sound waves to create images of your heart.  It provides your doctor with information about the size and shape of your heart and how well your heart's chambers and valves are working. This procedure takes approximately one hour. There are no restrictions for this procedure.  This will be done at our [facility] location:  [place]: At [facility], you and your health needs are our priority.  As part of our continuing mission to provide you with exceptional heart care, we have created designated Provider Care Teams.  These Care Teams include your primary Cardiologist (physician) and Advanced Practice Providers (APPs -  Physician Assistants and Nurse Practitioners) who all work together to provide you with the care you need, when you need it.  We recommend signing up for the patient portal called "MyChart".  Sign up information is provided on this After Visit Summary.  MyChart is used to connect with patients for Virtual Visits (Telemedicine).  Patients are able to view lab/test results, encounter notes, upcoming appointments, etc.  Non-urgent messages can be sent to your provider as well.   To learn more about what you can do with MyChart, go to [email or web address].    Your next appointment:   6 month(s)  The format for your next  appointment:   In Person  Provider:   Oswaldo Milian, MD        Signed, Donato Heinz, MD  12/18/2020 10:58 AM    [place]

## 2020-12-18 ENCOUNTER — Ambulatory Visit: Payer: Medicare HMO | Admitting: Cardiology

## 2020-12-18 ENCOUNTER — Other Ambulatory Visit: Payer: Self-pay

## 2020-12-18 ENCOUNTER — Encounter: Payer: Self-pay | Admitting: Cardiology

## 2020-12-18 VITALS — BP 138/68 | HR 86 | Ht 64.5 in | Wt 166.2 lb

## 2020-12-18 DIAGNOSIS — I2510 Atherosclerotic heart disease of native coronary artery without angina pectoris: Secondary | ICD-10-CM | POA: Diagnosis not present

## 2020-12-18 DIAGNOSIS — E785 Hyperlipidemia, unspecified: Secondary | ICD-10-CM

## 2020-12-18 DIAGNOSIS — Z720 Tobacco use: Secondary | ICD-10-CM

## 2020-12-18 DIAGNOSIS — I10 Essential (primary) hypertension: Secondary | ICD-10-CM

## 2020-12-18 DIAGNOSIS — R011 Cardiac murmur, unspecified: Secondary | ICD-10-CM | POA: Diagnosis not present

## 2020-12-18 NOTE — Patient Instructions (Signed)
Medication Instructions:  Your physician recommends that you continue on your current medications as directed. Please refer to the Current Medication list given to you today.  *If you need a refill on your cardiac medications before your next appointment, please call your pharmacy*   Lab Work: Lipid today  If you have labs (blood work) drawn today and your tests are completely normal, you will receive your results only by: Marland Kitchen MyChart Message (if you have MyChart) OR . A paper copy in the mail If you have any lab test that is abnormal or we need to change your treatment, we will call you to review the results.   Testing/Procedures: Your physician has requested that you have an echocardiogram. Echocardiography is a painless test that uses sound waves to create images of your heart. It provides your doctor with information about the size and shape of your heart and how well your heart's chambers and valves are working. This procedure takes approximately one hour. There are no restrictions for this procedure.  This will be done at our [facility] location:  [address]  Follow-Up: At [facility], you and your health needs are our priority.  As part of our continuing mission to provide you with exceptional heart care, we have created designated Provider Care Teams.  These Care Teams include your primary Cardiologist (physician) and Advanced Practice Providers (APPs -  Physician Assistants and Nurse Practitioners) who all work together to provide you with the care you need, when you need it.  We recommend signing up for the patient portal called "MyChart".  Sign up information is provided on this After Visit Summary.  MyChart is used to connect with patients for Virtual Visits (Telemedicine).  Patients are able to view lab/test results, encounter notes, upcoming appointments, etc.  Non-urgent messages can be sent to your provider as well.   To learn more about what you can do  with MyChart, go to [email or web address].    Your next appointment:   6 month(s)  The format for your next appointment:   In Person  Provider:   Epifanio Lesches, MD

## 2020-12-19 LAB — LIPID PANEL
Chol/HDL Ratio: 2.3 ratio (ref 0.0–5.0)
Cholesterol, Total: 137 mg/dL (ref 100–199)
HDL: 59 mg/dL (ref >39)
LDL Chol Calc (NIH): 64 mg/dL (ref 0–99)
Triglycerides: 67 mg/dL (ref 0–149)
VLDL Cholesterol Cal: 14 mg/dL (ref 5–40)

## 2020-12-31 ENCOUNTER — Other Ambulatory Visit: Payer: Self-pay | Admitting: *Deleted

## 2020-12-31 ENCOUNTER — Encounter: Payer: Self-pay | Admitting: *Deleted

## 2020-12-31 MED ORDER — ROSUVASTATIN CALCIUM 10 MG PO TABS: 10.0000 mg | ORAL_TABLET | Freq: Every day | ORAL | 3 refills | Status: DC

## 2021-01-06 ENCOUNTER — Other Ambulatory Visit: Payer: Self-pay

## 2021-01-06 ENCOUNTER — Ambulatory Visit (HOSPITAL_COMMUNITY): Payer: Medicare HMO | Attending: Internal Medicine

## 2021-01-06 DIAGNOSIS — R011 Cardiac murmur, unspecified: Secondary | ICD-10-CM | POA: Insufficient documentation

## 2021-01-06 LAB — ECHOCARDIOGRAM COMPLETE
AV Mean grad: 11 mmHg
Area-P 1/2: 2.87 cm2
S' Lateral: 3.2 cm

## 2021-03-17 DIAGNOSIS — H2000 Unspecified acute and subacute iridocyclitis: Secondary | ICD-10-CM | POA: Diagnosis not present

## 2021-03-17 DIAGNOSIS — H10501 Unspecified blepharoconjunctivitis, right eye: Secondary | ICD-10-CM | POA: Diagnosis not present

## 2021-03-24 DIAGNOSIS — H2000 Unspecified acute and subacute iridocyclitis: Secondary | ICD-10-CM | POA: Diagnosis not present

## 2021-07-05 NOTE — Progress Notes (Deleted)
Cardiology Office Note:    Date:  07/05/2021   ID:  Sean Sanders, DOB [birth year], MRN [id number]  PCP:  Hulan Fess, MD  Cardiologist:  None  Electrophysiologist:  None   Referring MD: Hulan Fess, MD   No chief complaint on file.   History of Present Illness:    Sean Sanders is a 80 y.o. male with a hx of hypertension, tobacco use who presents for follow-up.  He was referred by Dr. Rex Kras for evaluation of coronary calcification seen on CT, initially seen on 09/16/2020.  Three-vessel coronary artery calcification seen on CT chest on 04/29/2018.  He denies any chest pain or shortness of breath.  Reports he walks 2 times per week for 30 minutes, denies any exertional symptoms.  He denies any lightheadedness, syncope, palpitations, or lower extremity edema.  He smokes 1 pack/day, started at age 82.  No history of heart disease in his immediate family.  Echocardiogram on 01/06/2021 showed normal biventricular function, no significant valvular disease.  Since last clinic visit,  he reports that he has been doing well.  Denies any chest pain or dyspnea.  Reports walks twice per week for about 30 minutes, denies any exertional symptoms.  Denies any lightheadedness, syncope, palpitations, lower extremity edema.  Continues to smoke 1 pack/day.    No past medical history on file.  Current Medications: No outpatient medications have been marked as taking for the 07/06/21 encounter (Appointment) with Donato Heinz, MD.     Allergies:   Patient has no known allergies.   Social History   Socioeconomic History   Marital status: Married    Spouse name: Not on file   Number of children: Not on file   Years of education: Not on file   Highest education level: Not on file  Occupational History   Not on file  Tobacco Use   Smoking status: Every Day    Packs/day: 1.00    Years: 54.00    Pack years: 54.00    Types: Cigarettes    Start date: 1964   Smokeless tobacco: Never   Substance and Sexual Activity   Alcohol use: Not on file   Drug use: Not on file   Sexual activity: Not on file  Other Topics Concern   Not on file  Social History Narrative   Not on file   Social Determinants of Health   Financial Resource Strain: Not on file  Food Insecurity: Not on file  Transportation Needs: Not on file  Physical Activity: Not on file  Stress: Not on file  Social Connections: Not on file     Family History: The patient's family history is not on file.  ROS:   Please see the history of present illness.     All other systems reviewed and are negative.  EKGs/Labs/Other Studies Reviewed:    The following studies were reviewed today:   EKG:  EKG is ordered today.  The ekg ordered today demonstrates normal sinus rhythm, rate 86, no ST abnormalities  Recent Labs: No results found for requested labs within last 8760 hours.  Recent Lipid Panel    Component Value Date/Time   CHOL 137 12/18/2020 1028   TRIG 67 12/18/2020 1028   HDL 59 12/18/2020 1028   CHOLHDL 2.3 12/18/2020 1028   LDLCALC 64 12/18/2020 1028    Physical Exam:    VS:  There were no vitals taken for this visit.    Wt Readings from Last 3 Encounters:  12/18/20 166 lb  3.2 oz (75.4 kg)  09/16/20 158 lb (71.7 kg)     GEN:  Well nourished, well developed in no acute distress HEENT: Normal NECK: No JVD; No carotid bruits CARDIAC: RRR, 2 out of 6 systolic murmur RESPIRATORY:  Clear to auscultation without rales, wheezing or rhonchi  ABDOMEN: Soft, non-tender, non-distended MUSCULOSKELETAL:  No edema; No deformity  SKIN: Warm and dry NEUROLOGIC:  Alert and oriented x 3 PSYCHIATRIC:  Normal affect   ASSESSMENT:    No diagnosis found.  PLAN:    CAD: Three-vessel coronary artery calcification seen on CT chest on 04/29/2018.  Denies any chest pain or dyspnea. -Given patient is asymptomatic but does have significant coronary calcifications, discussed obtaining stress test to rule  out obstructive CAD.  He declines at this time.  Advised to let us know if develops any symptoms, as will plan for ischemia evaluation -Continue rosuvastatin 10 mg daily, will check lipid panel -Continue aspirin 81 mg daily  Heart murmur: Likely aortic sclerosis.  Echocardiogram on 01/06/2021 showed normal biventricular function, no significant valvular disease.  Hypertension: On amlodipine 5 mg daily, benazepril 10 mg daily.  Appears controlled.  Hyperlipidemia: LDL 110 on 08/14/2020.  10-year ASCVD risk score 32%.  Started on rosuvastatin 10 mg daily, will check lipid panel  Tobacco use: Patient counseled risk of tobacco use and cessation strongly encouraged.  Offered to have care guide work with him to help him quit smoking, but states he is not interested at this time.   RTC in ***   Medication Adjustments/Labs and Tests Ordered: Current medicines are reviewed at length with the patient today.  Concerns regarding medicines are outlined above.  No orders of the defined types were placed in this encounter.  No orders of the defined types were placed in this encounter.   There are no Patient Instructions on file for this visit.   Signed, Donato Heinz, MD  07/05/2021 2:44 PM    [place]

## 2021-07-06 ENCOUNTER — Ambulatory Visit: Payer: Medicare HMO | Admitting: Cardiology

## 2021-07-06 NOTE — Progress Notes (Signed)
Cardiology Office Note:    Date:  07/08/2021   ID:  Sean Sanders, DOB 1941-07-03, MRN [id number]  PCP:  Hulan Fess, MD  Cardiologist:  None  Electrophysiologist:  None   Referring MD: Hulan Fess, MD   Chief Complaint  Patient presents with   Coronary Artery Disease     History of Present Illness:    Sean Sanders is a 80 y.o. male with a hx of hypertension, tobacco use who presents for follow-up.  He was referred by Dr. Rex Kras for evaluation of coronary calcification seen on CT, initially seen on 09/16/2020.  Three-vessel coronary artery calcification seen on CT chest on 04/29/2018.  He denies any chest pain or shortness of breath.  Reports he walks 2 times per week for 30 minutes, denies any exertional symptoms.  He denies any lightheadedness, syncope, palpitations, or lower extremity edema.  He smokes 1 pack/day, started at age 48.  No history of heart disease in his immediate family.  Since last clinic visit, he reports he is doing OK. He walks 2 times out the week with duration of an hour with no complications. Denies any chest pains, lightheadedness, shortness of breath, LE edema, or palpitations. He recently lost his wife 05/22/2021. He smokes a pack of cigarettes a day.    No past medical history on file.  Current Medications: Current Meds  Medication Sig   amLODipine (NORVASC) 5 MG tablet    Ascorbic Acid (VITAMIN C WITH ROSE HIPS) 500 MG tablet Take 500 mg by mouth daily.   aspirin EC 81 MG tablet Take 81 mg by mouth daily. Swallow whole.   benazepril (LOTENSIN) 10 MG tablet benazepril 10 mg tablet   cholecalciferol (VITAMIN D3) 25 MCG (1000 UNIT) tablet Take 1,000 Units by mouth daily.   cyanocobalamin 100 MCG tablet Take 100 mcg by mouth daily.   Multiple Vitamin (MULTIVITAMIN) tablet Take 1 tablet by mouth daily.   Omega-3 Fatty Acids (FISH OIL) 1000 MG CAPS Take by mouth.   rosuvastatin (CRESTOR) 10 MG tablet Take 1 tablet (10 mg total) by mouth daily.      Allergies:   Patient has no known allergies.   Social History   Socioeconomic History   Marital status: Married    Spouse name: Not on file   Number of children: Not on file   Years of education: Not on file   Highest education level: Not on file  Occupational History   Not on file  Tobacco Use   Smoking status: Every Day    Packs/day: 1.00    Years: 54.00    Pack years: 54.00    Types: Cigarettes    Start date: 1964   Smokeless tobacco: Never  Substance and Sexual Activity   Alcohol use: Not on file   Drug use: Not on file   Sexual activity: Not on file  Other Topics Concern   Not on file  Social History Narrative   Not on file   Social Determinants of Health   Financial Resource Strain: Not on file  Food Insecurity: Not on file  Transportation Needs: Not on file  Physical Activity: Not on file  Stress: Not on file  Social Connections: Not on file     Family History: The patient's family history is not on file.  ROS:   Please see the history of present illness.     All other systems reviewed and are negative.  EKGs/Labs/Other Studies Reviewed:    The following studies were reviewed today:  Echo 01/22:   IMPRESSIONS    1. Left ventricular ejection fraction, by estimation, is 65 to 70%. The  left ventricle has normal function. The left ventricle has no regional  wall motion abnormalities. There is mild left ventricular hypertrophy.  Left ventricular diastolic parameters  are consistent with Grade I diastolic dysfunction (impaired relaxation).   2. Right ventricular systolic function is normal. The right ventricular  size is normal. There is normal pulmonary artery systolic pressure. The  estimated right ventricular systolic pressure is 123456 mmHg.   3. The mitral valve is grossly normal. No evidence of mitral valve  regurgitation.   4. The aortic valve was not well visualized. Aortic valve regurgitation  is not visualized. No aortic stenosis is  present. Aortic valve mean  gradient measures 11.0 mmHg.   5. The inferior vena cava is normal in size with greater than 50%  respiratory variability, suggesting right atrial pressure of 3 mmHg.   EKG:   07/22 sinus rhythm, PACs, rate 92 01/22:normal sinus rhythm, rate 86, no ST abnormalities  Recent Labs: No results found for requested labs within last 8760 hours.  Recent Lipid Panel    Component Value Date/Time   CHOL 137 12/18/2020 1028   TRIG 67 12/18/2020 1028   HDL 59 12/18/2020 1028   CHOLHDL 2.3 12/18/2020 1028   LDLCALC 64 12/18/2020 1028    Physical Exam:    VS:  BP 132/70   Pulse 92   Resp 18   Ht '5\' 6"'$  (1.676 m)   Wt 156 lb (70.8 kg)   SpO2 95%   BMI 25.18 kg/m     Wt Readings from Last 3 Encounters:  07/08/21 156 lb (70.8 kg)  12/18/20 166 lb 3.2 oz (75.4 kg)  09/16/20 158 lb (71.7 kg)     GEN:  Well nourished, well developed in no acute distress HEENT: Normal NECK: No JVD; No carotid bruits CARDIAC: RRR, 2/6 systolic murmur RESPIRATORY:  Clear to auscultation without rales, wheezing or rhonchi  ABDOMEN: Soft, non-tender, non-distended MUSCULOSKELETAL:  No edema; No deformity  SKIN: Warm and dry NEUROLOGIC:  Alert and oriented x 3 PSYCHIATRIC:  Normal affect   ASSESSMENT:    1. Coronary artery calcification seen on CT scan   2. Essential hypertension     PLAN:    CAD: Three-vessel coronary artery calcification seen on CT chest on 04/29/2018.  Denies any chest pain or dyspnea. -Given patient is asymptomatic but does have significant coronary calcifications, discussed obtaining stress test to rule out obstructive CAD but he declined.  Advised to let us know if develops any symptoms, as will plan for ischemia evaluation -Continue rosuvastatin 10 mg daily, LDL 64, at goal less than 70 -Continue aspirin 81 mg daily -Smoking cessation strongly encouraged   Heart murmur: Systolic murmur on exam, likely aortic sclerosis. Echocardiogram 01/06/21 shows  normal biventricular function, no significant valvular disease   Hypertension: On amlodipine 5 mg daily, benazepril 10 mg daily.  Appears controlled.   Hyperlipidemia: LDL 110 on 08/14/2020.  10-year ASCVD risk score 32%.  Started on rosuvastatin 10 mg daily, repeat lipid panel on 12/18/2020 showed LDL 64.   Tobacco use: Patient counseled risk of tobacco use and cessation strongly encouraged.  Offered to have care guide work with him to help him quit smoking, but states he is not interested at this time.   RTC in 1 year  Medication Adjustments/Labs and Tests Ordered: Current medicines are reviewed at length with the patient today.  Concerns regarding medicines are outlined above.  Orders Placed This Encounter  Procedures   EKG 12-Lead    No orders of the defined types were placed in this encounter.   Patient Instructions  Medication Instructions:  Your physician recommends that you continue on your current medications as directed. Please refer to the Current Medication list given to you today.  *If you need a refill on your cardiac medications before your next appointment, please call your pharmacy*  Follow-Up: At [facility], you and your health needs are our priority.  As part of our continuing mission to provide you with exceptional heart care, we have created designated Provider Care Teams.  These Care Teams include your primary Cardiologist (physician) and Advanced Practice Providers (APPs -  Physician Assistants and Nurse Practitioners) who all work together to provide you with the care you need, when you need it.  We recommend signing up for the patient portal called "MyChart".  Sign up information is provided on this After Visit Summary.  MyChart is used to connect with patients for Virtual Visits (Telemedicine).  Patients are able to view lab/test results, encounter notes, upcoming appointments, etc.  Non-urgent messages can be sent to your provider as well.   To learn more  about what you can do with MyChart, go to [email or web address].    Your next appointment:   12 month(s)  The format for your next appointment:   In Person  Provider:   Oswaldo Milian, MD     Ardell Isaacs as a scribe for Donato Heinz, MD.,have documented all relevant documentation on the behalf of Donato Heinz, MD,as directed by  Donato Heinz, MD while in the presence of Donato Heinz, MD.  I, Donato Heinz, MD, have reviewed all documentation for this visit. The documentation on 07/08/21 for the exam, diagnosis, procedures, and orders are all accurate and complete.   Signed, Donato Heinz, MD  07/08/2021 8:46 AM    [place]

## 2021-07-08 ENCOUNTER — Encounter: Payer: Self-pay | Admitting: Cardiology

## 2021-07-08 ENCOUNTER — Other Ambulatory Visit: Payer: Self-pay

## 2021-07-08 ENCOUNTER — Ambulatory Visit: Payer: Medicare HMO | Admitting: Cardiology

## 2021-07-08 VITALS — BP 132/70 | HR 92 | Resp 18 | Ht 66.0 in | Wt 156.0 lb

## 2021-07-08 DIAGNOSIS — E785 Hyperlipidemia, unspecified: Secondary | ICD-10-CM

## 2021-07-08 DIAGNOSIS — I2510 Atherosclerotic heart disease of native coronary artery without angina pectoris: Secondary | ICD-10-CM

## 2021-07-08 DIAGNOSIS — Z720 Tobacco use: Secondary | ICD-10-CM

## 2021-07-08 DIAGNOSIS — I10 Essential (primary) hypertension: Secondary | ICD-10-CM | POA: Diagnosis not present

## 2021-07-08 DIAGNOSIS — R011 Cardiac murmur, unspecified: Secondary | ICD-10-CM | POA: Diagnosis not present

## 2021-07-08 NOTE — Patient Instructions (Signed)

## 2021-09-07 DIAGNOSIS — Z23 Encounter for immunization: Secondary | ICD-10-CM | POA: Diagnosis not present

## 2021-09-07 DIAGNOSIS — R7301 Impaired fasting glucose: Secondary | ICD-10-CM | POA: Diagnosis not present

## 2021-09-07 DIAGNOSIS — E7800 Pure hypercholesterolemia, unspecified: Secondary | ICD-10-CM | POA: Diagnosis not present

## 2021-09-07 DIAGNOSIS — L989 Disorder of the skin and subcutaneous tissue, unspecified: Secondary | ICD-10-CM | POA: Diagnosis not present

## 2021-09-07 DIAGNOSIS — Z125 Encounter for screening for malignant neoplasm of prostate: Secondary | ICD-10-CM | POA: Diagnosis not present

## 2021-09-07 DIAGNOSIS — Z0000 Encounter for general adult medical examination without abnormal findings: Secondary | ICD-10-CM | POA: Diagnosis not present

## 2021-09-07 DIAGNOSIS — Z122 Encounter for screening for malignant neoplasm of respiratory organs: Secondary | ICD-10-CM | POA: Diagnosis not present

## 2021-09-07 DIAGNOSIS — Z1211 Encounter for screening for malignant neoplasm of colon: Secondary | ICD-10-CM | POA: Diagnosis not present

## 2021-09-07 DIAGNOSIS — I10 Essential (primary) hypertension: Secondary | ICD-10-CM | POA: Diagnosis not present

## 2021-09-07 DIAGNOSIS — M79673 Pain in unspecified foot: Secondary | ICD-10-CM | POA: Diagnosis not present

## 2021-09-15 ENCOUNTER — Telehealth: Payer: Self-pay | Admitting: Dermatology

## 2021-09-15 NOTE — Telephone Encounter (Signed)
Patient is calling for a referral appointment from Loura Halt, NP.  Patient does not want to wait until April 2023 for an appointment, so would like referral sent back to Loura Halt, NP's office.

## 2021-10-24 ENCOUNTER — Other Ambulatory Visit: Payer: Self-pay | Admitting: Cardiology

## 2021-11-11 DIAGNOSIS — H4720 Unspecified optic atrophy: Secondary | ICD-10-CM | POA: Diagnosis not present

## 2021-11-11 DIAGNOSIS — H35372 Puckering of macula, left eye: Secondary | ICD-10-CM | POA: Diagnosis not present

## 2021-11-11 DIAGNOSIS — H5203 Hypermetropia, bilateral: Secondary | ICD-10-CM | POA: Diagnosis not present

## 2021-11-11 DIAGNOSIS — H2513 Age-related nuclear cataract, bilateral: Secondary | ICD-10-CM | POA: Diagnosis not present

## 2021-11-27 ENCOUNTER — Other Ambulatory Visit: Payer: Self-pay | Admitting: *Deleted

## 2021-11-27 DIAGNOSIS — F17210 Nicotine dependence, cigarettes, uncomplicated: Secondary | ICD-10-CM

## 2021-11-27 DIAGNOSIS — Z87891 Personal history of nicotine dependence: Secondary | ICD-10-CM

## 2021-12-11 ENCOUNTER — Ambulatory Visit (INDEPENDENT_AMBULATORY_CARE_PROVIDER_SITE_OTHER)
Admission: RE | Admit: 2021-12-11 | Discharge: 2021-12-11 | Disposition: A | Discharge status: Home / Self Care | Payer: Medicare HMO | Source: Ambulatory Visit | Attending: Acute Care | Admitting: Acute Care

## 2021-12-11 ENCOUNTER — Other Ambulatory Visit: Payer: Self-pay

## 2021-12-11 DIAGNOSIS — Z87891 Personal history of nicotine dependence: Secondary | ICD-10-CM

## 2021-12-11 DIAGNOSIS — F17210 Nicotine dependence, cigarettes, uncomplicated: Secondary | ICD-10-CM | POA: Diagnosis not present

## 2021-12-16 ENCOUNTER — Other Ambulatory Visit: Payer: Self-pay | Admitting: Acute Care

## 2021-12-16 DIAGNOSIS — F17210 Nicotine dependence, cigarettes, uncomplicated: Secondary | ICD-10-CM

## 2021-12-16 DIAGNOSIS — Z87891 Personal history of nicotine dependence: Secondary | ICD-10-CM

## 2022-02-04 DIAGNOSIS — Z86010 Personal history of colonic polyps: Secondary | ICD-10-CM | POA: Diagnosis not present

## 2022-02-04 DIAGNOSIS — Z1211 Encounter for screening for malignant neoplasm of colon: Secondary | ICD-10-CM | POA: Diagnosis not present

## 2022-03-01 ENCOUNTER — Other Ambulatory Visit: Payer: Self-pay | Admitting: Cardiology

## 2022-09-10 DIAGNOSIS — Z1389 Encounter for screening for other disorder: Secondary | ICD-10-CM | POA: Diagnosis not present

## 2022-09-10 DIAGNOSIS — Z23 Encounter for immunization: Secondary | ICD-10-CM | POA: Diagnosis not present

## 2022-09-10 DIAGNOSIS — Z6828 Body mass index (BMI) 28.0-28.9, adult: Secondary | ICD-10-CM | POA: Diagnosis not present

## 2022-09-10 DIAGNOSIS — Z0000 Encounter for general adult medical examination without abnormal findings: Secondary | ICD-10-CM | POA: Diagnosis not present

## 2022-09-14 DIAGNOSIS — R972 Elevated prostate specific antigen [PSA]: Secondary | ICD-10-CM | POA: Diagnosis not present

## 2022-09-14 DIAGNOSIS — R7303 Prediabetes: Secondary | ICD-10-CM | POA: Diagnosis not present

## 2022-09-14 DIAGNOSIS — I700 Atherosclerosis of aorta: Secondary | ICD-10-CM | POA: Diagnosis not present

## 2022-09-14 DIAGNOSIS — M5442 Lumbago with sciatica, left side: Secondary | ICD-10-CM | POA: Diagnosis not present

## 2022-09-14 DIAGNOSIS — I10 Essential (primary) hypertension: Secondary | ICD-10-CM | POA: Diagnosis not present

## 2022-09-14 DIAGNOSIS — E7800 Pure hypercholesterolemia, unspecified: Secondary | ICD-10-CM | POA: Diagnosis not present

## 2022-09-14 DIAGNOSIS — G8929 Other chronic pain: Secondary | ICD-10-CM | POA: Diagnosis not present

## 2022-09-14 DIAGNOSIS — R296 Repeated falls: Secondary | ICD-10-CM | POA: Diagnosis not present

## 2022-09-23 DIAGNOSIS — M5442 Lumbago with sciatica, left side: Secondary | ICD-10-CM | POA: Diagnosis not present

## 2022-09-23 DIAGNOSIS — M5441 Lumbago with sciatica, right side: Secondary | ICD-10-CM | POA: Diagnosis not present

## 2022-09-23 DIAGNOSIS — R296 Repeated falls: Secondary | ICD-10-CM | POA: Diagnosis not present

## 2022-09-23 NOTE — Progress Notes (Signed)
Cardiology Clinic Note   Patient Name: Sean Sanders Date of Encounter: 09/24/2022  Primary Care Provider:  Hulan Fess, MD Primary Cardiologist:  Dr. Gardiner Sanders   Patient Profile    Mr. Sean Sanders is an 81 year old male patient we are following for hypertension, hyperlipidemia on rosuvastatin, tobacco abuse, with abnormal coronary CT scan revealing coronary artery calcification, on aspirin.  Echocardiogram January 2022 revealed normal LVEF of 65 to 72%, grade 1 diastolic dysfunction, no valvular abnormalities.  Coronary artery CT scan revealed three-vessel CAD on 04/29/2018.  He is being treated medically after being offered a stress test to evaluate for obstructive CAD and ischemia if he becomes symptomatic.On last office visit he reported that he walks 2 times a week for 60 minutes. He unfortunately continues to smoke.  Past Medical History    No past medical history on file.   Allergies  No Known Allergies  History of Present Illness    Mr. Sean Sanders presents today for ongoing assessment and management of nonobstructive CAD, hyperlipidemia, hypertension, with history of ongoing tobacco abuse.  He was found to have systolic heart murmur on examination on prior office visit likely related to aortic sclerosis. He was to report any symptoms of chest pain, DOE or new fatigue, which would prompt ischemic testing.   Mr. Sean Sanders comes today without any cardiac complaints.  He has begun going to a rehab facility to help him with lower back pain and bilateral leg pain.  He just started going yesterday.  He no longer walks as he used to because of the hip pain and back pain and has been assigned this type of rehab to allow him to exercise and strengthen.  He also has exercises which he is to complete at home.  He denies any shortness of breath discomfort or extreme fatigue surrounding his exercise routine.  He unfortunately continues to smoke a pack a day and has no desire to quit.  He is medically  compliant, and denies any problems receiving his medications or taking them.  Home Medications    Current Outpatient Medications  Medication Sig Dispense Refill   amLODipine (NORVASC) 5 MG tablet      Ascorbic Acid (VITAMIN C WITH ROSE HIPS) 500 MG tablet Take 500 mg by mouth daily.     aspirin EC 81 MG tablet Take 81 mg by mouth daily. Swallow whole.     benazepril (LOTENSIN) 10 MG tablet benazepril 10 mg tablet     cholecalciferol (VITAMIN D3) 25 MCG (1000 UNIT) tablet Take 1,000 Units by mouth daily.     cyanocobalamin 100 MCG tablet Take 100 mcg by mouth daily.     Multiple Vitamin (MULTIVITAMIN) tablet Take 1 tablet by mouth daily.     Omega-3 Fatty Acids (FISH OIL) 1000 MG CAPS Take by mouth.     rosuvastatin (CRESTOR) 10 MG tablet TAKE 1 TABLET EVERY DAY 90 tablet 1   gabapentin (NEURONTIN) 100 MG capsule Take 100 mg by mouth at bedtime.     sulfamethoxazole-trimethoprim (BACTRIM DS) 800-160 MG tablet Take 1 tablet by mouth 2 (two) times daily.     No current facility-administered medications for this visit.     Family History    No family history on file. has no family status information on file.   Social History    Social History   Socioeconomic History   Marital status: Married    Spouse name: Not on file   Number of children: Not on file   Years of education: Not  on file   Highest education level: Not on file  Occupational History   Not on file  Tobacco Use   Smoking status: Every Day    Packs/day: 1.00    Years: 54.00    Total pack years: 54.00    Types: Cigarettes    Start date: 1964   Smokeless tobacco: Never  Substance and Sexual Activity   Alcohol use: Not on file   Drug use: Not on file   Sexual activity: Not on file  Other Topics Concern   Not on file  Social History Narrative   Not on file   Social Determinants of Health   Financial Resource Strain: Not on file  Food Insecurity: Not on file  Transportation Needs: Not on file  Physical  Activity: Not on file  Stress: Not on file  Social Connections: Not on file  Intimate Partner Violence: Not on file     Review of Systems    General:  No chills, fever, night sweats or weight changes.  Cardiovascular:  No chest pain, dyspnea on exertion, edema, orthopnea, palpitations, paroxysmal nocturnal dyspnea. Dermatological: No rash, lesions/masses Respiratory: No cough, dyspnea Urologic: No hematuria, dysuria Abdominal:   No nausea, vomiting, diarrhea, bright red blood per rectum, melena, or hematemesis Neurologic:  No visual changes, wkns, changes in mental status. All other systems reviewed and are otherwise negative except as noted above.     Physical Exam    VS:  BP (!) 140/66 (BP Location: Left Arm, Patient Position: Sitting)   Pulse 93   Wt 165 lb 6.4 oz (75 kg)   SpO2 98%   BMI 26.70 kg/m  , BMI Body mass index is 26.7 kg/m.     GEN: Well nourished, well developed, in no acute distress. HEENT: normal. Neck: Supple, no JVD, carotid bruits, or masses. Cardiac: RRR, soft systolic murmurs, rubs, or gallops. No clubbing, cyanosis, edema.  Radials/DP/PT 1+ and equal bilaterally.  Respiratory:  Respirations regular and unlabored, clear to auscultation bilaterally, with exception of crackles in the left base not cleared with cough. GI: Soft, nontender, nondistended, BS + x 4. MS: no deformity or atrophy. Skin: warm and dry, no rash. Neuro:  Strength and sensation are intact. Psych: Normal affect.  Accessory Clinical Findings    ECG personally reviewed by me today-normal sinus rhythm, heart rate 93 bpm- No acute changes  No results found for: "WBC", "HGB", "HCT", "MCV", "PLT" No results found for: "CREATININE", "BUN", "NA", "K", "CL", "CO2" No results found for: "ALT", "AST", "GGT", "ALKPHOS", "BILITOT" Lab Results  Component Value Date   CHOL 137 12/18/2020   HDL 59 12/18/2020   LDLCALC 64 12/18/2020   TRIG 67 12/18/2020   CHOLHDL 2.3 12/18/2020    No  results found for: "HGBA1C"  Review of Prior Studies: 12/17/2020 Echocardiogram 1. Left ventricular ejection fraction, by estimation, is 65 to 70%. The  left ventricle has normal function. The left ventricle has no regional  wall motion abnormalities. There is mild left ventricular hypertrophy.  Left ventricular diastolic parameters  are consistent with Grade I diastolic dysfunction (impaired relaxation).   2. Right ventricular systolic function is normal. The right ventricular  size is normal. There is normal pulmonary artery systolic pressure. The  estimated right ventricular systolic pressure is 33.2 mmHg.   3. The mitral valve is grossly normal. No evidence of mitral valve  regurgitation.   4. The aortic valve was not well visualized. Aortic valve regurgitation  is not visualized. No aortic stenosis  is present. Aortic valve mean  gradient measures 11.0 mmHg.   5. The inferior vena cava is normal in size with greater than 50%  respiratory variability, suggesting right atrial pressure of 3 mmHg.   Assessment & Plan   1.  Coronary artery disease: Abnormal coronary CT scan 04/29/2018 revealing three-vessel coronary artery disease.  He is currently treated medically and is on aspirin.  He denies any new cardiac symptoms which would lead to ischemic testing at this time.  He unfortunately has stopped exercising regularly as he is having significant back problems and leg problems but he is going to a rehabilitation center as an outpatient for strengthening.  He denies any exercise-induced symptoms.  Continue aspirin, ACE inhibitor, and statin.  2.  Hypertension: Slightly elevated today but not significantly so.  No changes to his medication regimen.  He will continue on amlodipine, benazepril, low-salt diet is recommended.  3.  Hypercholesterolemia: Goal of LDL is less than 70 with known CAD.  Labs are being followed by his primary care provider.  No changes in his regimen at this time concerning  statin therapy dosing.  4.  Ongoing tobacco abuse: I have discussed with him smoking cessation or at least cutting down to prevent progression of known CAD.  He is not inclined to quit at this time although he is aware of the dangers, and feels he probably should quit he is just not ready to at this time.    Current medicines are reviewed at length with the patient today.  I have spent 25 min's  dedicated to the care of this patient on the date of this encounter to include pre-visit review of records, assessment, management and diagnostic testing,with shared decision making. Signed, Phill Myron. West Pugh, ANP, AACC   09/24/2022 1:52 PM      Office [phone] Fax [phone]  Notice: This dictation was prepared with Dragon dictation along with smaller phrase technology. Any transcriptional errors that result from this process are unintentional and may not be corrected upon review.

## 2022-09-24 ENCOUNTER — Encounter: Payer: Self-pay | Admitting: Adult Health

## 2022-09-24 ENCOUNTER — Ambulatory Visit: Payer: Medicare HMO | Attending: Adult Health | Admitting: Adult Health

## 2022-09-24 VITALS — BP 140/66 | HR 93 | Wt 165.4 lb

## 2022-09-24 DIAGNOSIS — I10 Essential (primary) hypertension: Secondary | ICD-10-CM

## 2022-09-24 DIAGNOSIS — E7800 Pure hypercholesterolemia, unspecified: Secondary | ICD-10-CM

## 2022-09-24 DIAGNOSIS — Z720 Tobacco use: Secondary | ICD-10-CM | POA: Diagnosis not present

## 2022-09-24 DIAGNOSIS — I2510 Atherosclerotic heart disease of native coronary artery without angina pectoris: Secondary | ICD-10-CM

## 2022-09-24 NOTE — Patient Instructions (Signed)
Medication Instructions:  No Changes *If you need a refill on your cardiac medications before your next appointment, please call your pharmacy*   Lab Work: No Labs If you have labs (blood work) drawn today and your tests are completely normal, you will receive your results only by: [place] (if you have MyChart) OR A paper copy in the mail If you have any lab test that is abnormal or we need to change your treatment, we will call you to review the results.   Testing/Procedures: No Testing   Follow-Up: At [facility], you and your health needs are our priority.  As part of our continuing mission to provide you with exceptional heart care, we have created designated Provider Care Teams.  These Care Teams include your primary Cardiologist (physician) and Advanced Practice Providers (APPs -  Physician Assistants and Nurse Practitioners) who all work together to provide you with the care you need, when you need it.  We recommend signing up for the patient portal called "MyChart".  Sign up information is provided on this After Visit Summary.  MyChart is used to connect with patients for Virtual Visits (Telemedicine).  Patients are able to view lab/test results, encounter notes, upcoming appointments, etc.  Non-urgent messages can be sent to your provider as well.   To learn more about what you can do with MyChart, go to [email or web address].    Your next appointment:   6 month(s)  The format for your next appointment:   In Person  Provider:   Oswaldo Milian, MD

## 2022-09-28 DIAGNOSIS — M5441 Lumbago with sciatica, right side: Secondary | ICD-10-CM | POA: Diagnosis not present

## 2022-09-28 DIAGNOSIS — M5442 Lumbago with sciatica, left side: Secondary | ICD-10-CM | POA: Diagnosis not present

## 2022-09-28 DIAGNOSIS — R296 Repeated falls: Secondary | ICD-10-CM | POA: Diagnosis not present

## 2022-10-07 DIAGNOSIS — M5442 Lumbago with sciatica, left side: Secondary | ICD-10-CM | POA: Diagnosis not present

## 2022-10-07 DIAGNOSIS — M5441 Lumbago with sciatica, right side: Secondary | ICD-10-CM | POA: Diagnosis not present

## 2022-10-07 DIAGNOSIS — R296 Repeated falls: Secondary | ICD-10-CM | POA: Diagnosis not present

## 2022-10-12 DIAGNOSIS — M5441 Lumbago with sciatica, right side: Secondary | ICD-10-CM | POA: Diagnosis not present

## 2022-10-12 DIAGNOSIS — R296 Repeated falls: Secondary | ICD-10-CM | POA: Diagnosis not present

## 2022-10-12 DIAGNOSIS — M5442 Lumbago with sciatica, left side: Secondary | ICD-10-CM | POA: Diagnosis not present

## 2022-10-13 ENCOUNTER — Other Ambulatory Visit: Payer: Self-pay | Admitting: Cardiology

## 2022-10-19 DIAGNOSIS — M5442 Lumbago with sciatica, left side: Secondary | ICD-10-CM | POA: Diagnosis not present

## 2022-10-19 DIAGNOSIS — M5441 Lumbago with sciatica, right side: Secondary | ICD-10-CM | POA: Diagnosis not present

## 2022-10-19 DIAGNOSIS — R296 Repeated falls: Secondary | ICD-10-CM | POA: Diagnosis not present

## 2022-10-26 DIAGNOSIS — M5442 Lumbago with sciatica, left side: Secondary | ICD-10-CM | POA: Diagnosis not present

## 2022-10-26 DIAGNOSIS — M5441 Lumbago with sciatica, right side: Secondary | ICD-10-CM | POA: Diagnosis not present

## 2022-10-26 DIAGNOSIS — R296 Repeated falls: Secondary | ICD-10-CM | POA: Diagnosis not present

## 2022-11-02 DIAGNOSIS — M5441 Lumbago with sciatica, right side: Secondary | ICD-10-CM | POA: Diagnosis not present

## 2022-11-02 DIAGNOSIS — M5442 Lumbago with sciatica, left side: Secondary | ICD-10-CM | POA: Diagnosis not present

## 2022-11-02 DIAGNOSIS — R296 Repeated falls: Secondary | ICD-10-CM | POA: Diagnosis not present

## 2022-11-09 DIAGNOSIS — M5441 Lumbago with sciatica, right side: Secondary | ICD-10-CM | POA: Diagnosis not present

## 2022-11-09 DIAGNOSIS — M5442 Lumbago with sciatica, left side: Secondary | ICD-10-CM | POA: Diagnosis not present

## 2022-11-09 DIAGNOSIS — R296 Repeated falls: Secondary | ICD-10-CM | POA: Diagnosis not present

## 2022-11-18 DIAGNOSIS — H52203 Unspecified astigmatism, bilateral: Secondary | ICD-10-CM | POA: Diagnosis not present

## 2022-11-18 DIAGNOSIS — H4720 Unspecified optic atrophy: Secondary | ICD-10-CM | POA: Diagnosis not present

## 2022-11-18 DIAGNOSIS — H2513 Age-related nuclear cataract, bilateral: Secondary | ICD-10-CM | POA: Diagnosis not present

## 2022-11-18 DIAGNOSIS — H524 Presbyopia: Secondary | ICD-10-CM | POA: Diagnosis not present

## 2022-12-14 ENCOUNTER — Ambulatory Visit (HOSPITAL_COMMUNITY)
Admission: RE | Admit: 2022-12-14 | Discharge: 2022-12-14 | Disposition: A | Discharge status: Home / Self Care | Payer: Medicare HMO | Source: Ambulatory Visit | Attending: Acute Care | Admitting: Acute Care

## 2022-12-14 DIAGNOSIS — I2510 Atherosclerotic heart disease of native coronary artery without angina pectoris: Secondary | ICD-10-CM | POA: Diagnosis not present

## 2022-12-14 DIAGNOSIS — F17210 Nicotine dependence, cigarettes, uncomplicated: Secondary | ICD-10-CM | POA: Insufficient documentation

## 2022-12-14 DIAGNOSIS — I700 Atherosclerosis of aorta: Secondary | ICD-10-CM | POA: Diagnosis not present

## 2022-12-14 DIAGNOSIS — J439 Emphysema, unspecified: Secondary | ICD-10-CM | POA: Diagnosis not present

## 2022-12-14 DIAGNOSIS — Z87891 Personal history of nicotine dependence: Secondary | ICD-10-CM

## 2022-12-14 DIAGNOSIS — Z122 Encounter for screening for malignant neoplasm of respiratory organs: Secondary | ICD-10-CM | POA: Diagnosis not present

## 2023-03-14 ENCOUNTER — Other Ambulatory Visit: Payer: Self-pay | Admitting: Cardiology

## 2023-03-16 DIAGNOSIS — I10 Essential (primary) hypertension: Secondary | ICD-10-CM | POA: Diagnosis not present

## 2023-03-16 DIAGNOSIS — F17200 Nicotine dependence, unspecified, uncomplicated: Secondary | ICD-10-CM | POA: Diagnosis not present

## 2023-03-16 DIAGNOSIS — M5442 Lumbago with sciatica, left side: Secondary | ICD-10-CM | POA: Diagnosis not present

## 2023-03-16 DIAGNOSIS — E7800 Pure hypercholesterolemia, unspecified: Secondary | ICD-10-CM | POA: Diagnosis not present

## 2023-03-16 DIAGNOSIS — I700 Atherosclerosis of aorta: Secondary | ICD-10-CM | POA: Diagnosis not present

## 2023-03-16 DIAGNOSIS — J439 Emphysema, unspecified: Secondary | ICD-10-CM | POA: Diagnosis not present

## 2023-03-16 DIAGNOSIS — J019 Acute sinusitis, unspecified: Secondary | ICD-10-CM | POA: Diagnosis not present

## 2023-03-16 DIAGNOSIS — F1010 Alcohol abuse, uncomplicated: Secondary | ICD-10-CM | POA: Diagnosis not present

## 2023-03-16 DIAGNOSIS — Z6827 Body mass index (BMI) 27.0-27.9, adult: Secondary | ICD-10-CM | POA: Diagnosis not present

## 2023-03-28 DIAGNOSIS — H2000 Unspecified acute and subacute iridocyclitis: Secondary | ICD-10-CM | POA: Diagnosis not present

## 2023-03-30 DIAGNOSIS — H02402 Unspecified ptosis of left eyelid: Secondary | ICD-10-CM | POA: Diagnosis not present

## 2023-03-30 DIAGNOSIS — H2000 Unspecified acute and subacute iridocyclitis: Secondary | ICD-10-CM | POA: Diagnosis not present

## 2023-04-22 DIAGNOSIS — H02402 Unspecified ptosis of left eyelid: Secondary | ICD-10-CM | POA: Diagnosis not present

## 2023-04-22 DIAGNOSIS — H2000 Unspecified acute and subacute iridocyclitis: Secondary | ICD-10-CM | POA: Diagnosis not present

## 2023-08-11 IMAGING — CT CT CHEST LUNG CANCER SCREENING LOW DOSE W/O CM
2 of 4 series · 15 of 36 positions shown, 18 images · non-contrast
Comparison: Multiple priors, most recent chest CT dated October 10, 2020

CLINICAL DATA: Current smoker with 58 pack-year history

EXAM:
CT CHEST WITHOUT CONTRAST LOW-DOSE FOR LUNG CANCER SCREENING
TECHNIQUE: Multidetector CT imaging of the chest was performed following the
standard protocol without IV contrast.

[Series 3: lung thins 1.0 · axial · 0.79mm/px · z∈[-301,-24]mm · 12 of 305 slices shown, 15 images]
[im 14/305  mediastinal]
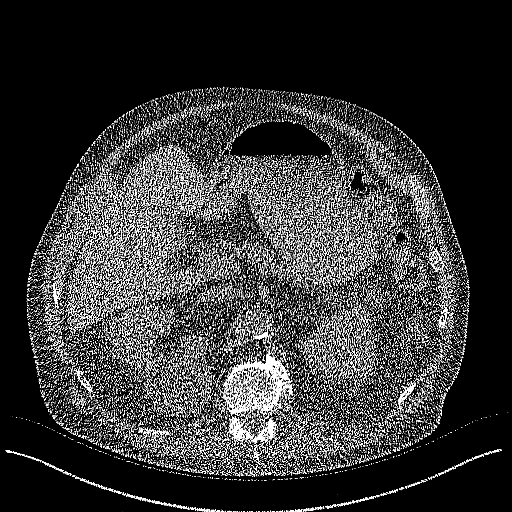
[im 14/305  lung]
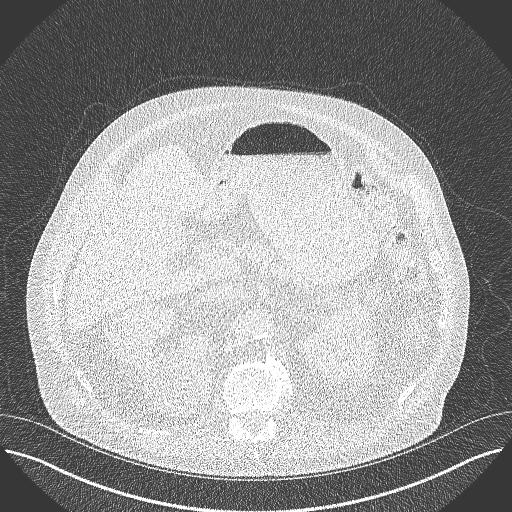
[im 42/305  lung]
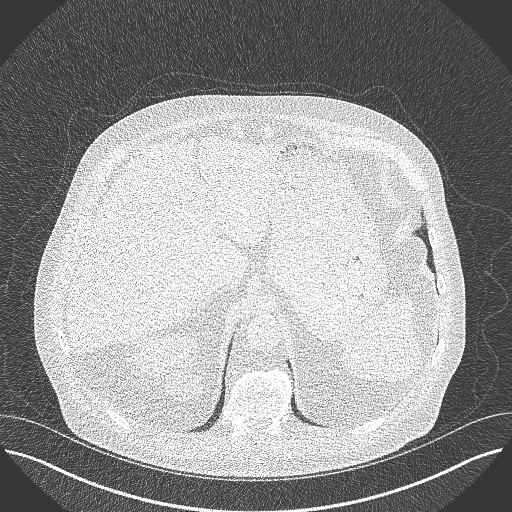
[im 70/305  lung]
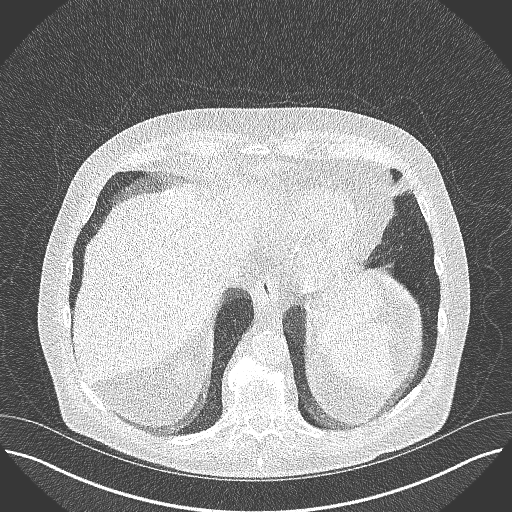
[im 97/305  lung]
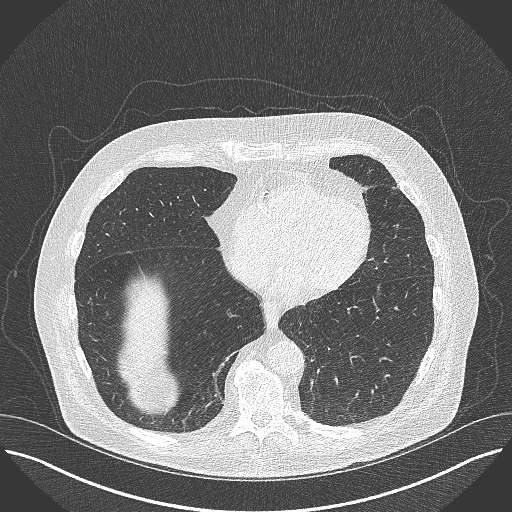
[im 111/305  mediastinal]
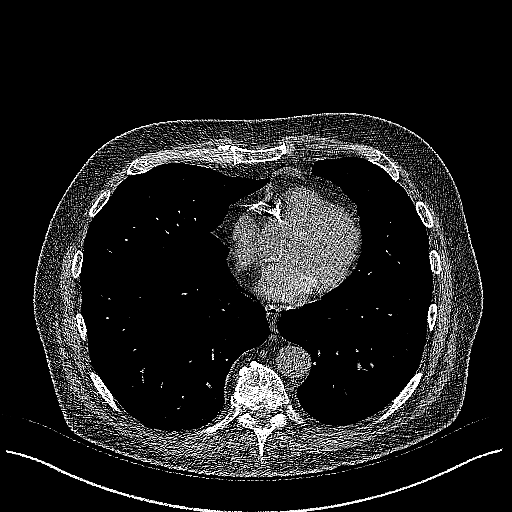
[im 111/305  lung]
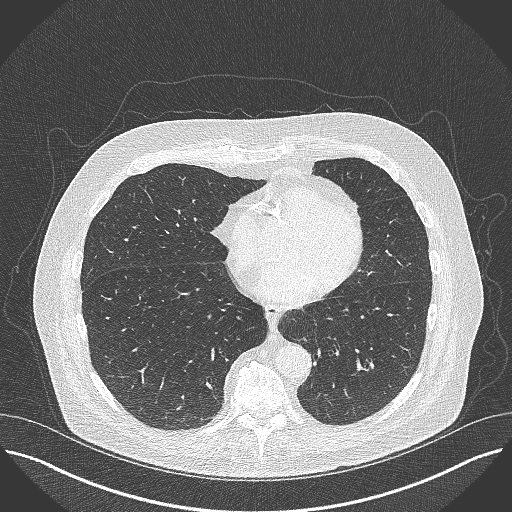
[im 139/305  lung]
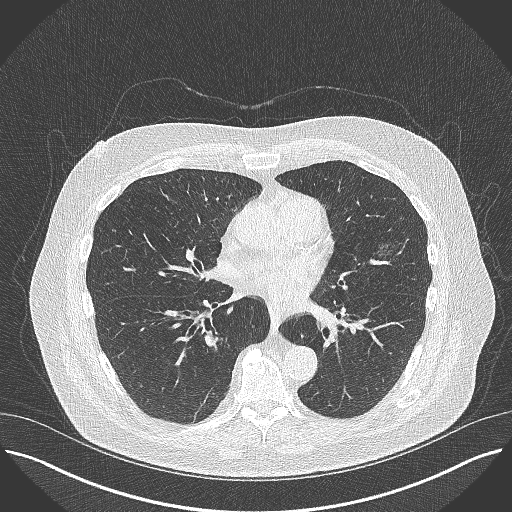
[im 166/305  lung]
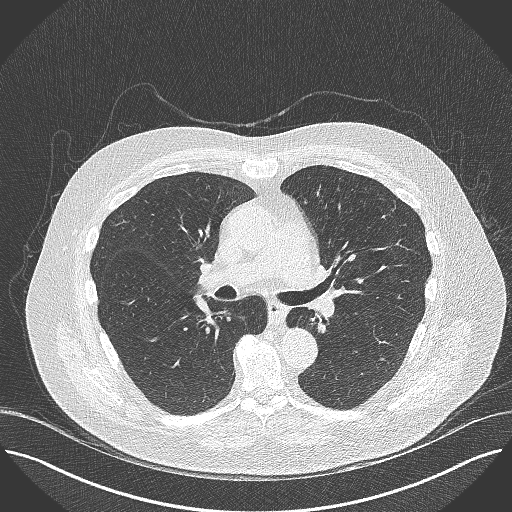
[im 194/305  lung]
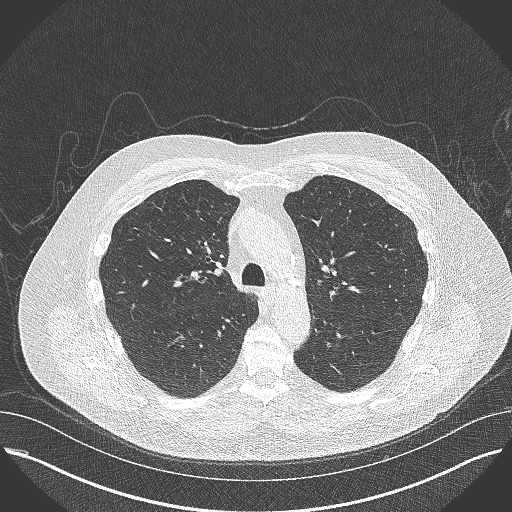
[im 208/305  mediastinal]
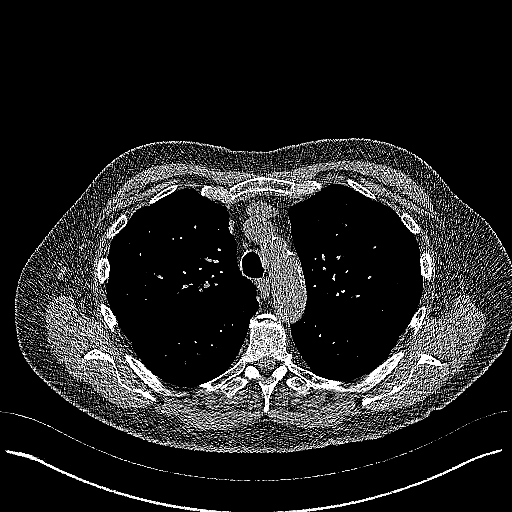
[im 208/305  lung]
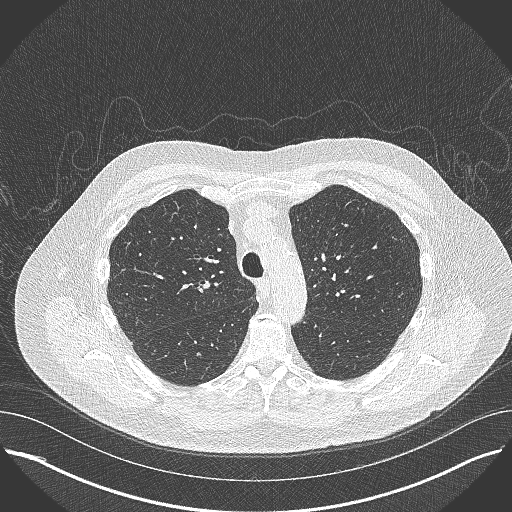
[im 235/305  lung]
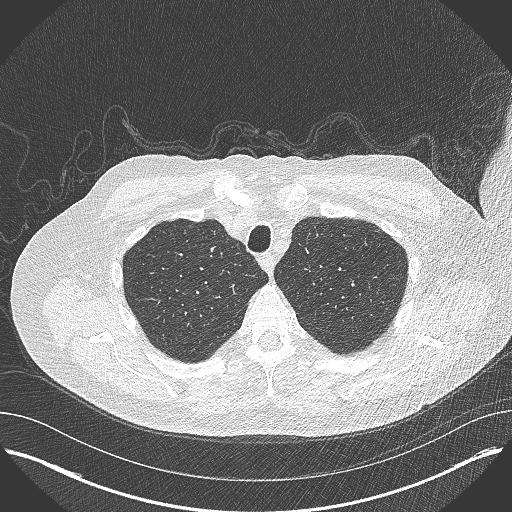
[im 263/305  lung]
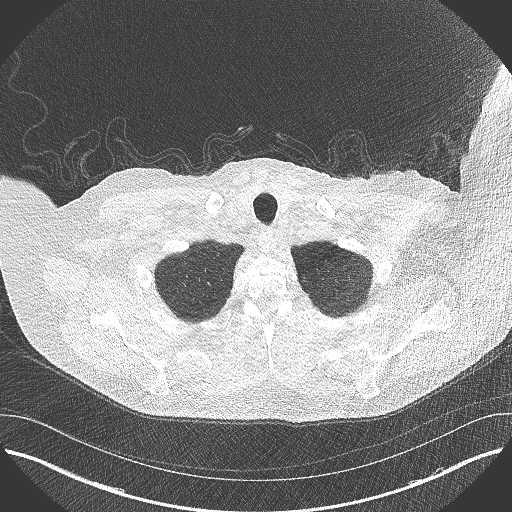
[im 291/305  lung]
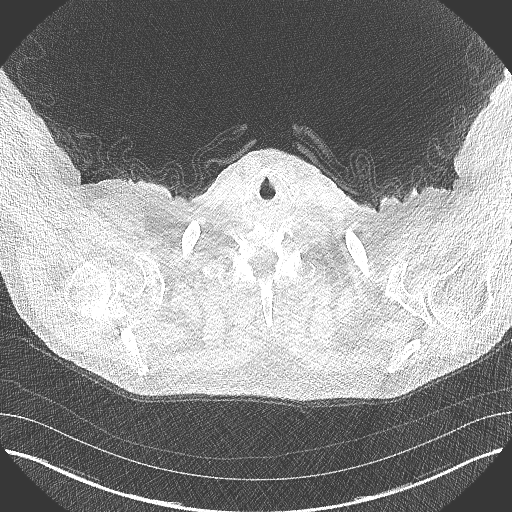

[Series 5: coronal · coronal · 0.59mm/px · 3 of 133 slices shown]
[im 27/133  lung]
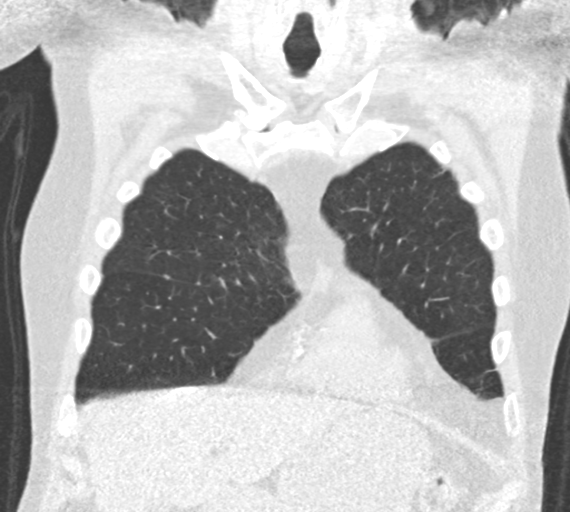
[im 53/133  lung]
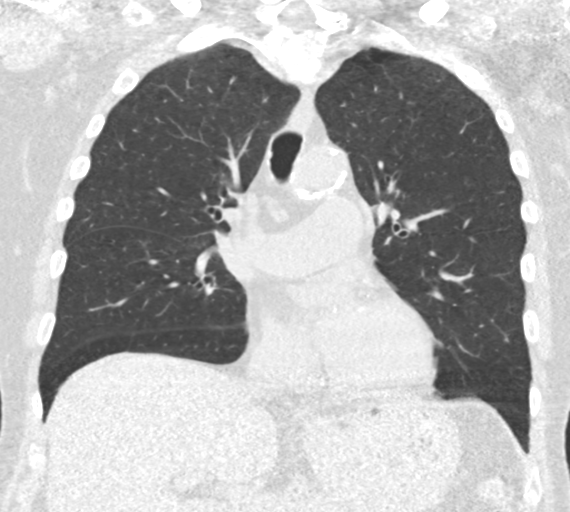
[im 80/133  lung]
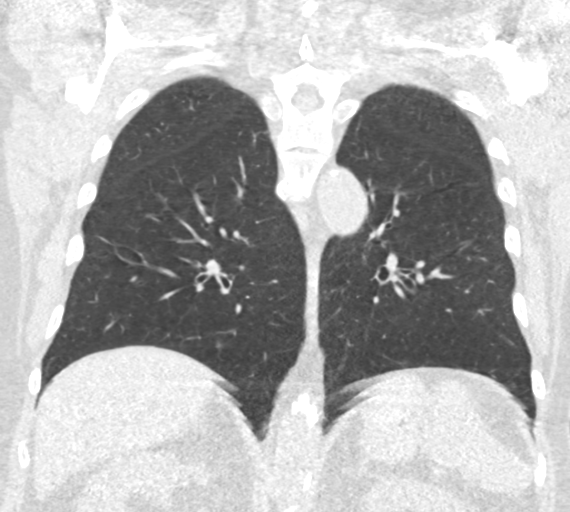

[15 of 36 positions shown; findings below may reference images not displayed]

FINDINGS: Cardiovascular: Normal heart size. No pericardial effusion. Left
main and three-vessel coronary artery calcifications.
Atherosclerotic disease the thoracic aorta.

Mediastinum/Nodes: No pathologically enlarged lymph nodes in the
chest. Small hiatal hernia.

Lungs/Pleura: Central airways are patent. Centrilobular emphysema.
No consolidation, pleural effusion or pneumothorax. Stable
ground-glass nodule of the left upper lobe measuring 10.9 mm in mean
diameter on image 167.

Upper Abdomen: No acute abnormality.

Musculoskeletal: No chest wall mass or suspicious bone lesions
identified.
IMPRESSION: 1. Lung-RADS 2, benign appearance or behavior. Continue annual
screening with low-dose chest CT without contrast in 12 months.
2. Left main and three-vessel coronary artery calcifications.
3. Aortic Atherosclerosis (3DZWJ-PK1.1) and Emphysema (3DZWJ-[id number].C).

## 2023-09-14 DIAGNOSIS — Z1331 Encounter for screening for depression: Secondary | ICD-10-CM | POA: Diagnosis not present

## 2023-09-14 DIAGNOSIS — Z6826 Body mass index (BMI) 26.0-26.9, adult: Secondary | ICD-10-CM | POA: Diagnosis not present

## 2023-09-14 DIAGNOSIS — Z9181 History of falling: Secondary | ICD-10-CM | POA: Diagnosis not present

## 2023-09-14 DIAGNOSIS — Z0000 Encounter for general adult medical examination without abnormal findings: Secondary | ICD-10-CM | POA: Diagnosis not present

## 2023-09-14 DIAGNOSIS — Z23 Encounter for immunization: Secondary | ICD-10-CM | POA: Diagnosis not present

## 2023-09-19 NOTE — Progress Notes (Unsigned)
Cardiology Office Note:    Date:  09/20/2023   ID:  Charm Rings, DOB 04-03-1941, MRN [id number]  PCP:  Catha Gosselin, MD  Cardiologist:  None  Electrophysiologist:  None   Referring MD: Catha Gosselin, MD   Chief Complaint  Patient presents with   Coronary Artery Disease    History of Present Illness:    Sean Sanders is a 82 y.o. male with a hx of hypertension, tobacco use who presents for follow-up.  He was referred by Dr. Clarene Duke for evaluation of coronary calcification seen on CT, initially seen on 09/16/2020.  Three-vessel coronary artery calcification seen on CT chest on 04/29/2018.  He denies any chest pain or shortness of breath.  Reports he walks 2 times per week for 30 minutes, denies any exertional symptoms.  He denies any lightheadedness, syncope, palpitations, or lower extremity edema.  He smokes 1 pack/day, started at age 53.  No history of heart disease in his immediate family.  Since last clinic visit, he reports he is doing okay. Denies any chest pain, dyspnea, lightheadedness, syncope, lower extremity edema, or palpitations.  Does not exercise due to pain from sciatica. Continues to smoke 1 pack/day.   No past medical history on file.  Current Medications: Current Meds  Medication Sig   amLODipine (NORVASC) 5 MG tablet    Ascorbic Acid (VITAMIN C WITH ROSE HIPS) 500 MG tablet Take 500 mg by mouth daily.   aspirin EC 81 MG tablet Take 81 mg by mouth daily. Swallow whole.   benazepril (LOTENSIN) 10 MG tablet benazepril 10 mg tablet   cholecalciferol (VITAMIN D3) 25 MCG (1000 UNIT) tablet Take 1,000 Units by mouth daily.   Multiple Vitamin (MULTIVITAMIN) tablet Take 1 tablet by mouth daily.   Omega-3 Fatty Acids (FISH OIL) 1000 MG CAPS Take by mouth.   rosuvastatin (CRESTOR) 10 MG tablet TAKE 1 TABLET EVERY DAY     Allergies:   Patient has no known allergies.   Social History   Socioeconomic History   Marital status: Widowed    Spouse name: Not on file    Number of children: Not on file   Years of education: Not on file   Highest education level: Not on file  Occupational History   Not on file  Tobacco Use   Smoking status: Every Day    Current packs/day: 1.00    Average packs/day: 1 pack/day for 60.8 years (60.8 ttl pk-yrs)    Types: Cigarettes    Start date: 1964   Smokeless tobacco: Never  Substance and Sexual Activity   Alcohol use: Not on file   Drug use: Not on file   Sexual activity: Not on file  Other Topics Concern   Not on file  Social History Narrative   Not on file   Social Determinants of Health   Financial Resource Strain: Not on file  Food Insecurity: Not on file  Transportation Needs: Not on file  Physical Activity: Not on file  Stress: Not on file  Social Connections: Not on file     Family History: The patient's family history is not on file.  ROS:   Please see the history of present illness.     All other systems reviewed and are negative.  EKGs/Labs/Other Studies Reviewed:    The following studies were reviewed today:  Echo 01/22:   IMPRESSIONS    1. Left ventricular ejection fraction, by estimation, is 65 to 70%. The  left ventricle has normal function. The left ventricle  has no regional  wall motion abnormalities. There is mild left ventricular hypertrophy.  Left ventricular diastolic parameters  are consistent with Grade I diastolic dysfunction (impaired relaxation).   2. Right ventricular systolic function is normal. The right ventricular  size is normal. There is normal pulmonary artery systolic pressure. The  estimated right ventricular systolic pressure is 24.5 mmHg.   3. The mitral valve is grossly normal. No evidence of mitral valve  regurgitation.   4. The aortic valve was not well visualized. Aortic valve regurgitation  is not visualized. No aortic stenosis is present. Aortic valve mean  gradient measures 11.0 mmHg.   5. The inferior vena cava is normal in size with greater than  50%  respiratory variability, suggesting right atrial pressure of 3 mmHg.   EKG:   09/20/2023: Normal sinus rhythm, right axis deviation, rate 93 07/22 sinus rhythm, PACs, rate 92 01/22:normal sinus rhythm, rate 86, no ST abnormalities  Recent Labs: No results found for requested labs within last 365 days.  Recent Lipid Panel    Component Value Date/Time   CHOL 137 12/18/2020 1028   TRIG 67 12/18/2020 1028   HDL 59 12/18/2020 1028   CHOLHDL 2.3 12/18/2020 1028   LDLCALC 64 12/18/2020 1028    Physical Exam:    VS:  BP 132/64   Pulse 93   Ht 5\' 8"  (1.727 m)   Wt 159 lb (72.1 kg)   SpO2 98%   BMI 24.18 kg/m     Wt Readings from Last 3 Encounters:  09/20/23 159 lb (72.1 kg)  09/24/22 165 lb 6.4 oz (75 kg)  07/08/21 156 lb (70.8 kg)     GEN:  Well nourished, well developed in no acute distress HEENT: Normal NECK: No JVD; No carotid bruits CARDIAC: RRR, 2/6 systolic murmur RESPIRATORY:  Clear to auscultation without rales, wheezing or rhonchi  ABDOMEN: Soft, non-tender, non-distended MUSCULOSKELETAL:  No edema; No deformity  SKIN: Warm and dry NEUROLOGIC:  Alert and oriented x 3 PSYCHIATRIC:  Normal affect   ASSESSMENT:    1. Coronary artery disease involving native coronary artery of native heart without angina pectoris   2. Coronary artery calcification   3. Essential hypertension   4. Hypercholesterolemia   5. Tobacco use      PLAN:    CAD: Severe coronary artery calcifications seen on CT chest on 12/2022.  Denies any chest pain or dyspnea but does not exercise -Recommend stress PET evaluate for obstructive CAD -Continue rosuvastatin 10 mg daily, LDL 73 -Continue aspirin 81 mg daily -Smoking cessation strongly encouraged   Heart murmur: Systolic murmur on exam, likely aortic sclerosis. Echocardiogram 01/06/21 shows normal biventricular function, no significant valvular disease   Hypertension: On amlodipine 5 mg daily, benazepril 10 mg daily.  Appears  controlled.   Hyperlipidemia: LDL 110 on 08/14/2020.  10-year ASCVD risk score 32%.  Started on rosuvastatin 10 mg daily, repeat lipid panel on 12/18/2020 showed LDL 64.  LDL 73 on 03/16/2023   Tobacco use: Patient counseled risk of tobacco use and cessation strongly encouraged.  Offered to have care guide work with him to help him quit smoking, but states he is not interested at this time.   RTC in 6 months  Informed Consent   Shared Decision Making/Informed Consent The risks [chest pain, shortness of breath, cardiac arrhythmias, dizziness, blood pressure fluctuations, myocardial infarction, stroke/transient ischemic attack, nausea, vomiting, allergic reaction, radiation exposure, metallic taste sensation and life-threatening complications (estimated to be 1 in 10,000)], benefits (  risk stratification, diagnosing coronary artery disease, treatment guidance) and alternatives of a cardiac PET stress test were discussed in detail with Mr. Quezada and he agrees to proceed.      Medication Adjustments/Labs and Tests Ordered: Current medicines are reviewed at length with the patient today.  Concerns regarding medicines are outlined above.  Orders Placed This Encounter  Procedures   NM PET CT CARDIAC PERFUSION MULTI W/ABSOLUTE BLOODFLOW   EKG 12-Lead    No orders of the defined types were placed in this encounter.    Patient Instructions  Medication Instructions:  Continue all current medication *If you need a refill on your cardiac medications before your next appointment, please call your pharmacy*   Lab Work: None    Testing/Procedures: Stress Pet Scan. Will be schedule after approved by [employer]. Follow Instructions below.    Follow-Up: At [facility], you and your health needs are our priority.  As part of our continuing mission to provide you with exceptional heart care, we have created designated Provider Care Teams.  These Care Teams include your primary Cardiologist  (physician) and Advanced Practice Providers (APPs -  Physician Assistants and Nurse Practitioners) who all work together to provide you with the care you need, when you need it.  We recommend signing up for the patient portal called "MyChart".  Sign up information is provided on this After Visit Summary.  MyChart is used to connect with patients for Virtual Visits (Telemedicine).  Patients are able to view lab/test results, encounter notes, upcoming appointments, etc.  Non-urgent messages can be sent to your provider as well.   To learn more about what you can do with MyChart, go to [email or web address].    Your next appointment:  6 months. Call in Jan 2025 to make April appt.    Provider: Dr, Bjorn Pippin      How to Prepare for Your Cardiac PET/CT Stress Test:  1. Please do not take these medications before your test:   Medications that may interfere with the cardiac pharmacological stress agent (ex. nitrates - including erectile dysfunction medications, isosorbide mononitrate, tamulosin or beta-blockers) the day of the exam. (Erectile dysfunction medication should be held for at least 72 hrs prior to test) Theophylline containing medications for 12 hours. Dipyridamole 48 hours prior to the test. Your remaining medications may be taken with water.  2. Nothing to eat or drink, except water, 3 hours prior to arrival time.   NO caffeine/decaffeinated products, or chocolate 12 hours prior to arrival.  3. NO perfume, cologne or lotion on chest or abdomen area.          -   4. Total time is 1 to 2 hours; you may want to bring reading material for the waiting time.  5. Please report to Radiology at the [facility] Main Entrance 30 minutes early for your test.  [address] [place], [place] [address]     In preparation for your appointment, medication and supplies will be purchased.  Appointment availability is limited, so if you need to cancel or reschedule, please call the  Radiology Department at [phone] [facility]) [place] [phone] [facility])  24 hours in advance to avoid a cancellation fee of $100.00  What to Expect After you Arrive:  Once you arrive and check in for your appointment, you will be taken to a preparation room within the Radiology Department.  A technologist or Nurse will obtain your medical history, verify that you are correctly prepped for the  exam, and explain the procedure.  Afterwards,  an IV will be started in your arm and electrodes will be placed on your skin for EKG monitoring during the stress portion of the exam. Then you will be escorted to the PET/CT scanner.  There, staff will get you positioned on the scanner and obtain a blood pressure and EKG.  During the exam, you will continue to be connected to the EKG and blood pressure machines.  A small, safe amount of a radioactive tracer will be injected in your IV to obtain a series of pictures of your heart along with an injection of a stress agent.    After your Exam:  It is recommended that you eat a meal and drink a caffeinated beverage to counter act any effects of the stress agent.  Drink plenty of fluids for the remainder of the day and urinate frequently for the first couple of hours after the exam.  Your doctor will inform you of your test results within 7-10 business days.  For more information and frequently asked questions, please visit our website : [email or web address]  For questions about your test or how to prepare for your test, please call: Cardiac Imaging Nurse Navigators Office: [phone]         Signed, Little Ishikawa, MD  09/20/2023 1:12 PM    [facility]

## 2023-09-20 ENCOUNTER — Ambulatory Visit: Payer: Medicare HMO | Attending: Cardiology | Admitting: Cardiology

## 2023-09-20 VITALS — BP 132/64 | HR 93 | Ht 68.0 in | Wt 159.0 lb

## 2023-09-20 DIAGNOSIS — E559 Vitamin D deficiency, unspecified: Secondary | ICD-10-CM | POA: Diagnosis not present

## 2023-09-20 DIAGNOSIS — Z720 Tobacco use: Secondary | ICD-10-CM | POA: Diagnosis not present

## 2023-09-20 DIAGNOSIS — M545 Low back pain, unspecified: Secondary | ICD-10-CM | POA: Diagnosis not present

## 2023-09-20 DIAGNOSIS — I700 Atherosclerosis of aorta: Secondary | ICD-10-CM | POA: Diagnosis not present

## 2023-09-20 DIAGNOSIS — I2510 Atherosclerotic heart disease of native coronary artery without angina pectoris: Secondary | ICD-10-CM

## 2023-09-20 DIAGNOSIS — E7800 Pure hypercholesterolemia, unspecified: Secondary | ICD-10-CM

## 2023-09-20 DIAGNOSIS — F17200 Nicotine dependence, unspecified, uncomplicated: Secondary | ICD-10-CM | POA: Diagnosis not present

## 2023-09-20 DIAGNOSIS — I10 Essential (primary) hypertension: Secondary | ICD-10-CM

## 2023-09-20 NOTE — Patient Instructions (Addendum)
Medication Instructions:  Continue all current medication *If you need a refill on your cardiac medications before your next appointment, please call your pharmacy*   Lab Work: None    Testing/Procedures: Stress Pet Scan. Will be schedule after approved by [employer]. Follow Instructions below.    Follow-Up: At [facility], you and your health needs are our priority.  As part of our continuing mission to provide you with exceptional heart care, we have created designated Provider Care Teams.  These Care Teams include your primary Cardiologist (physician) and Advanced Practice Providers (APPs -  Physician Assistants and Nurse Practitioners) who all work together to provide you with the care you need, when you need it.  We recommend signing up for the patient portal called "MyChart".  Sign up information is provided on this After Visit Summary.  MyChart is used to connect with patients for Virtual Visits (Telemedicine).  Patients are able to view lab/test results, encounter notes, upcoming appointments, etc.  Non-urgent messages can be sent to your provider as well.   To learn more about what you can do with MyChart, go to [email or web address].    Your next appointment:  6 months. Call in Jan 2025 to make April appt.    Provider: Dr, Bjorn Pippin      How to Prepare for Your Cardiac PET/CT Stress Test:  1. Please do not take these medications before your test:   Medications that may interfere with the cardiac pharmacological stress agent (ex. nitrates - including erectile dysfunction medications, isosorbide mononitrate, tamulosin or beta-blockers) the day of the exam. (Erectile dysfunction medication should be held for at least 72 hrs prior to test) Theophylline containing medications for 12 hours. Dipyridamole 48 hours prior to the test. Your remaining medications may be taken with water.  2. Nothing to eat or drink, except water, 3 hours prior to arrival time.   NO  caffeine/decaffeinated products, or chocolate 12 hours prior to arrival.  3. NO perfume, cologne or lotion on chest or abdomen area.          -   4. Total time is 1 to 2 hours; you may want to bring reading material for the waiting time.  5. Please report to Radiology at the [facility] Main Entrance 30 minutes early for your test.  [address] [place], [place] [address]     In preparation for your appointment, medication and supplies will be purchased.  Appointment availability is limited, so if you need to cancel or reschedule, please call the Radiology Department at [phone] [facility]) OR [phone] [facility])  24 hours in advance to avoid a cancellation fee of $100.00  What to Expect After you Arrive:  Once you arrive and check in for your appointment, you will be taken to a preparation room within the Radiology Department.  A technologist or Nurse will obtain your medical history, verify that you are correctly prepped for the exam, and explain the procedure.  Afterwards,  an IV will be started in your arm and electrodes will be placed on your skin for EKG monitoring during the stress portion of the exam. Then you will be escorted to the PET/CT scanner.  There, staff will get you positioned on the scanner and obtain a blood pressure and EKG.  During the exam, you will continue to be connected to the EKG and blood pressure machines.  A small, safe amount of a radioactive tracer will be injected in your IV to obtain a series of  pictures of your heart along with an injection of a stress agent.    After your Exam:  It is recommended that you eat a meal and drink a caffeinated beverage to counter act any effects of the stress agent.  Drink plenty of fluids for the remainder of the day and urinate frequently for the first couple of hours after the exam.  Your doctor will inform you of your test results within 7-10 business days.  For more information and frequently asked  questions, please visit our website : [email or web address]  For questions about your test or how to prepare for your test, please call: Cardiac Imaging Nurse Navigators Office: [phone]

## 2023-10-06 ENCOUNTER — Encounter (HOSPITAL_COMMUNITY): Payer: Self-pay

## 2023-10-11 ENCOUNTER — Ambulatory Visit (HOSPITAL_COMMUNITY)
Admission: RE | Admit: 2023-10-11 | Discharge: 2023-10-11 | Disposition: A | Discharge status: Home / Self Care | Payer: Medicare HMO | Source: Ambulatory Visit | Attending: Cardiology | Admitting: Cardiology

## 2023-10-11 DIAGNOSIS — I2510 Atherosclerotic heart disease of native coronary artery without angina pectoris: Secondary | ICD-10-CM | POA: Diagnosis not present

## 2023-10-11 LAB — NM PET CT CARDIAC PERFUSION MULTI W/ABSOLUTE BLOODFLOW
LV dias vol: 69 mL (ref 62–150)
LV sys vol: 29 mL
MBFR: 1.71
Nuc Rest EF: 58 %
Nuc Stress EF: 65 %
Peak HR: 112 {beats}/min
Rest HR: 93 {beats}/min
Rest MBF: 1.68 ml/g/min
Rest Nuclear Isotope Dose: 18.7 mCi
ST Depression (mm): 0 mm
Stress MBF: 2.88 ml/g/min
Stress Nuclear Isotope Dose: 18.7 mCi

## 2023-10-11 MED ORDER — RUBIDIUM RB82 GENERATOR (RUBYFILL)
18.7100 | PACK | Freq: Once | INTRAVENOUS | Status: AC
  Administered 2023-10-11: 18.71 via INTRAVENOUS

## 2023-10-11 MED ORDER — REGADENOSON 0.4 MG/5ML IV SOLN
0.4000 mg | Freq: Once | INTRAVENOUS | Status: AC
  Administered 2023-10-11: 0.4 mg via INTRAVENOUS

## 2023-10-11 MED ORDER — REGADENOSON 0.4 MG/5ML IV SOLN
INTRAVENOUS | Status: AC
  Filled 2023-10-11: qty 5

## 2023-10-11 MED ORDER — RUBIDIUM RB82 GENERATOR (RUBYFILL)
18.6900 | PACK | Freq: Once | INTRAVENOUS | Status: AC
  Administered 2023-10-11: 18.69 via INTRAVENOUS

## 2023-11-21 DIAGNOSIS — H5203 Hypermetropia, bilateral: Secondary | ICD-10-CM | POA: Diagnosis not present

## 2023-11-21 DIAGNOSIS — H35372 Puckering of macula, left eye: Secondary | ICD-10-CM | POA: Diagnosis not present

## 2023-11-21 DIAGNOSIS — H2513 Age-related nuclear cataract, bilateral: Secondary | ICD-10-CM | POA: Diagnosis not present

## 2024-01-08 ENCOUNTER — Other Ambulatory Visit: Payer: Self-pay | Admitting: Cardiology

## 2024-03-22 DIAGNOSIS — J439 Emphysema, unspecified: Secondary | ICD-10-CM | POA: Diagnosis not present

## 2024-03-22 DIAGNOSIS — I700 Atherosclerosis of aorta: Secondary | ICD-10-CM | POA: Diagnosis not present

## 2024-03-22 DIAGNOSIS — E7800 Pure hypercholesterolemia, unspecified: Secondary | ICD-10-CM | POA: Diagnosis not present

## 2024-03-22 DIAGNOSIS — I10 Essential (primary) hypertension: Secondary | ICD-10-CM | POA: Diagnosis not present

## 2024-03-22 DIAGNOSIS — Z6826 Body mass index (BMI) 26.0-26.9, adult: Secondary | ICD-10-CM | POA: Diagnosis not present

## 2024-03-23 ENCOUNTER — Encounter: Payer: Self-pay | Admitting: Cardiology

## 2024-03-23 ENCOUNTER — Ambulatory Visit: Payer: Medicare HMO | Attending: Cardiology | Admitting: Cardiology

## 2024-03-23 VITALS — BP 136/72 | HR 97 | Ht 68.0 in | Wt 154.2 lb

## 2024-03-23 DIAGNOSIS — I10 Essential (primary) hypertension: Secondary | ICD-10-CM

## 2024-03-23 DIAGNOSIS — E7800 Pure hypercholesterolemia, unspecified: Secondary | ICD-10-CM

## 2024-03-23 DIAGNOSIS — I2510 Atherosclerotic heart disease of native coronary artery without angina pectoris: Secondary | ICD-10-CM | POA: Diagnosis not present

## 2024-03-23 DIAGNOSIS — Z720 Tobacco use: Secondary | ICD-10-CM

## 2024-03-23 NOTE — Progress Notes (Signed)
 Cardiology Office Note:    Date:  03/23/2024   ID:  Sean Sanders, DOB [birth year], MRN [id number]  PCP:  Catha Gosselin, MD  Cardiologist:  None  Electrophysiologist:  None   Referring MD: Catha Gosselin, MD   Chief Complaint  Patient presents with   Coronary Artery Disease    History of Present Illness:    Sean Sanders is a 83 y.o. male with a hx of hypertension, tobacco use who presents for follow-up.  He was referred by Dr. Clarene Duke for evaluation of coronary calcification seen on CT, initially seen on 09/16/2020.  Three-vessel coronary artery calcification seen on CT chest on 04/29/2018.  He denies any chest pain or shortness of breath.  Reports he walks 2 times per week for 30 minutes, denies any exertional symptoms.  He denies any lightheadedness, syncope, palpitations, or lower extremity edema.  He smokes 1 pack/day, started at age 71.  No history of heart disease in his immediate family.  Stress PET 10/11/2023 showed likely normal study, perfusion was normal but mild reduction in myocardial blood flow reserve driven by high rest flows (absolute myocardial blood flow at stress was normal).  Since last clinic visit, he reports he is doing okay.  Denies any chest pain, dyspnea, lightheadedness, syncope, lower extremity edema, or palpitations.  Continues to smoke 1 pack/day.  He has not been exercising, has been limited by sciatica.   No past medical history on file.  Current Medications: Current Meds  Medication Sig   amLODipine (NORVASC) 5 MG tablet    Ascorbic Acid (VITAMIN C WITH ROSE HIPS) 500 MG tablet Take 500 mg by mouth daily.   aspirin EC 81 MG tablet Take 81 mg by mouth daily. Swallow whole.   benazepril (LOTENSIN) 10 MG tablet benazepril 10 mg tablet   cholecalciferol (VITAMIN D3) 25 MCG (1000 UNIT) tablet Take 1,000 Units by mouth daily.   cyanocobalamin 100 MCG tablet Take 100 mcg by mouth daily.   Multiple Vitamin (MULTIVITAMIN) tablet Take 1 tablet by mouth daily.    Omega-3 Fatty Acids (FISH OIL) 1000 MG CAPS Take by mouth.   rosuvastatin (CRESTOR) 10 MG tablet TAKE 1 TABLET EVERY DAY     Allergies:   Patient has no known allergies.   Social History   Socioeconomic History   Marital status: Widowed    Spouse name: Not on file   Number of children: Not on file   Years of education: Not on file   Highest education level: Not on file  Occupational History   Not on file  Tobacco Use   Smoking status: Every Day    Current packs/day: 1.00    Average packs/day: 1 pack/day for 61.3 years (61.3 ttl pk-yrs)    Types: Cigarettes    Start date: 1964   Smokeless tobacco: Never  Substance and Sexual Activity   Alcohol use: Not on file   Drug use: Not on file   Sexual activity: Not on file  Other Topics Concern   Not on file  Social History Narrative   Not on file   Social Drivers of Health   Financial Resource Strain: Not on file  Food Insecurity: Not on file  Transportation Needs: Not on file  Physical Activity: Not on file  Stress: Not on file  Social Connections: Not on file     Family History: The patient's family history is not on file.  ROS:   Please see the history of present illness.     All other  systems reviewed and are negative.  EKGs/Labs/Other Studies Reviewed:    The following studies were reviewed today:  Echo 01/22:   IMPRESSIONS    1. Left ventricular ejection fraction, by estimation, is 65 to 70%. The  left ventricle has normal function. The left ventricle has no regional  wall motion abnormalities. There is mild left ventricular hypertrophy.  Left ventricular diastolic parameters  are consistent with Grade I diastolic dysfunction (impaired relaxation).   2. Right ventricular systolic function is normal. The right ventricular  size is normal. There is normal pulmonary artery systolic pressure. The  estimated right ventricular systolic pressure is 24.5 mmHg.   3. The mitral valve is grossly normal. No  evidence of mitral valve  regurgitation.   4. The aortic valve was not well visualized. Aortic valve regurgitation  is not visualized. No aortic stenosis is present. Aortic valve mean  gradient measures 11.0 mmHg.   5. The inferior vena cava is normal in size with greater than 50%  respiratory variability, suggesting right atrial pressure of 3 mmHg.   EKG:   03/23/24: Normal sinus rhythm, nonspecific T wave flattening, rate 97 09/20/2023: Normal sinus rhythm, right axis deviation, rate 93 07/22 sinus rhythm, PACs, rate 92 01/22:normal sinus rhythm, rate 86, no ST abnormalities  Recent Labs: No results found for requested labs within last 365 days.  Recent Lipid Panel    Component Value Date/Time   CHOL 137 12/18/2020 1028   TRIG 67 12/18/2020 1028   HDL 59 12/18/2020 1028   CHOLHDL 2.3 12/18/2020 1028   LDLCALC 64 12/18/2020 1028    Physical Exam:    VS:  BP 136/72 (BP Location: Left Arm, Patient Position: Sitting, Cuff Size: Normal)   Pulse 97   Ht 5\' 8"  (1.727 m)   Wt 154 lb 3.2 oz (69.9 kg)   SpO2 97%   BMI 23.45 kg/m     Wt Readings from Last 3 Encounters:  03/23/24 154 lb 3.2 oz (69.9 kg)  09/20/23 159 lb (72.1 kg)  09/24/22 165 lb 6.4 oz (75 kg)     GEN:  Well nourished, well developed in no acute distress HEENT: Normal NECK: No JVD; No carotid bruits CARDIAC: RRR, 2/6 systolic murmur RESPIRATORY:  Clear to auscultation without rales, wheezing or rhonchi  ABDOMEN: Soft, non-tender, non-distended MUSCULOSKELETAL:  No edema; No deformity  SKIN: Warm and dry NEUROLOGIC:  Alert and oriented x 3 PSYCHIATRIC:  Normal affect   ASSESSMENT:    1. Coronary artery disease involving native coronary artery of native heart without angina pectoris   2. Essential hypertension   3. Hypercholesterolemia   4. Tobacco use       PLAN:    CAD: Severe coronary artery calcifications seen on CT chest on 12/2022.  Denies any chest pain or dyspnea but does not exercise.   Stress PET 10/11/2023 showed likely normal study, perfusion was normal but mild reduction in myocardial blood flow reserve driven by high rest flows (absolute myocardial blood flow at stress was normal). -Continue rosuvastatin 10 mg daily, LDL 73 -Continue aspirin 81 mg daily -Smoking cessation strongly encouraged   Heart murmur: Systolic murmur on exam, likely aortic sclerosis. Echocardiogram 01/06/21 shows normal biventricular function, no significant valvular disease   Hypertension: On amlodipine 5 mg daily, benazepril 10 mg daily.  Appears controlled.   Hyperlipidemia: On rosuvastatin 10 mg daily, LDL 64 on 09/20/2023   Tobacco use: Patient counseled risk of tobacco use and cessation strongly encouraged.  Offered to have  care guide work with him to help him quit smoking, but states he is not interested   RTC in 1 year    Medication Adjustments/Labs and Tests Ordered: Current medicines are reviewed at length with the patient today.  Concerns regarding medicines are outlined above.  Orders Placed This Encounter  Procedures   EKG 12-Lead    No orders of the defined types were placed in this encounter.    Patient Instructions  Medication Instructions:  Continue current medication *If you need a refill on your cardiac medications before your next appointment, please call your pharmacy*  Lab Work: none If you have labs (blood work) drawn today and your tests are completely normal, you will receive your results only by: MyChart Message (if you have MyChart) OR A paper copy in the mail If you have any lab test that is abnormal or we need to change your treatment, we will call you to review the results.  Testing/Procedures: none  Follow-Up: At [facility], you and your health needs are our priority.  As part of our continuing mission to provide you with exceptional heart care, our providers are all part of one team.  This team includes your primary Cardiologist  (physician) and Advanced Practice Providers or APPs (Physician Assistants and Nurse Practitioners) who all work together to provide you with the care you need, when you need it.  Your next appointment:   1 year(s)  Provider:   Dr. Bjorn Pippin  We recommend signing up for the patient portal called "MyChart".  Sign up information is provided on this After Visit Summary.  MyChart is used to connect with patients for Virtual Visits (Telemedicine).  Patients are able to view lab/test results, encounter notes, upcoming appointments, etc.  Non-urgent messages can be sent to your provider as well.   To learn more about what you can do with MyChart, go to [email or web address].   Other Instructions none      1st Floor: - Lobby - Registration  - Pharmacy  - Lab - Cafe  2nd Floor: - PV Lab - Diagnostic Testing (echo, CT, nuclear med)  3rd Floor: - Vacant  4th Floor: - TCTS (cardiothoracic surgery) - AFib Clinic - Structural Heart Clinic - Vascular Surgery  - Vascular Ultrasound  5th Floor: - HeartCare Cardiology (general and EP) - Clinical Pharmacy for coumadin, hypertension, lipid, weight-loss medications, and med management appointments    Valet parking services will be available as well.        Signed, Little Ishikawa, MD  03/23/2024 10:10 AM    [facility]

## 2024-03-23 NOTE — Patient Instructions (Signed)
 Medication Instructions:  Continue current medication *If you need a refill on your cardiac medications before your next appointment, please call your pharmacy*  Lab Work: none If you have labs (blood work) drawn today and your tests are completely normal, you will receive your results only by: MyChart Message (if you have MyChart) OR A paper copy in the mail If you have any lab test that is abnormal or we need to change your treatment, we will call you to review the results.  Testing/Procedures: none  Follow-Up: At [facility], you and your health needs are our priority.  As part of our continuing mission to provide you with exceptional heart care, our providers are all part of one team.  This team includes your primary Cardiologist (physician) and Advanced Practice Providers or APPs (Physician Assistants and Nurse Practitioners) who all work together to provide you with the care you need, when you need it.  Your next appointment:   1 year(s)  Provider:   Dr. Bjorn Pippin  We recommend signing up for the patient portal called "MyChart".  Sign up information is provided on this After Visit Summary.  MyChart is used to connect with patients for Virtual Visits (Telemedicine).  Patients are able to view lab/test results, encounter notes, upcoming appointments, etc.  Non-urgent messages can be sent to your provider as well.   To learn more about what you can do with MyChart, go to [email or web address].   Other Instructions none      1st Floor: - Lobby - Registration  - Pharmacy  - Lab - Cafe  2nd Floor: - PV Lab - Diagnostic Testing (echo, CT, nuclear med)  3rd Floor: - Vacant  4th Floor: - TCTS (cardiothoracic surgery) - AFib Clinic - Structural Heart Clinic - Vascular Surgery  - Vascular Ultrasound  5th Floor: - HeartCare Cardiology (general and EP) - Clinical Pharmacy for coumadin, hypertension, lipid, weight-loss medications, and med management  appointments    Valet parking services will be available as well.

## 2024-05-12 DIAGNOSIS — I10 Essential (primary) hypertension: Secondary | ICD-10-CM | POA: Diagnosis not present

## 2024-05-12 DIAGNOSIS — J439 Emphysema, unspecified: Secondary | ICD-10-CM | POA: Diagnosis not present

## 2024-05-12 DIAGNOSIS — E7800 Pure hypercholesterolemia, unspecified: Secondary | ICD-10-CM | POA: Diagnosis not present

## 2024-06-11 DIAGNOSIS — J439 Emphysema, unspecified: Secondary | ICD-10-CM | POA: Diagnosis not present

## 2024-06-11 DIAGNOSIS — E7800 Pure hypercholesterolemia, unspecified: Secondary | ICD-10-CM | POA: Diagnosis not present

## 2024-06-11 DIAGNOSIS — I10 Essential (primary) hypertension: Secondary | ICD-10-CM | POA: Diagnosis not present

## 2024-07-12 DIAGNOSIS — E7800 Pure hypercholesterolemia, unspecified: Secondary | ICD-10-CM | POA: Diagnosis not present

## 2024-07-12 DIAGNOSIS — J439 Emphysema, unspecified: Secondary | ICD-10-CM | POA: Diagnosis not present

## 2024-07-12 DIAGNOSIS — I10 Essential (primary) hypertension: Secondary | ICD-10-CM | POA: Diagnosis not present

## 2024-07-13 DIAGNOSIS — H16041 Marginal corneal ulcer, right eye: Secondary | ICD-10-CM | POA: Diagnosis not present

## 2024-07-13 DIAGNOSIS — Z0189 Encounter for other specified special examinations: Secondary | ICD-10-CM | POA: Diagnosis not present

## 2024-07-14 DIAGNOSIS — H16041 Marginal corneal ulcer, right eye: Secondary | ICD-10-CM | POA: Diagnosis not present

## 2024-07-16 DIAGNOSIS — H16041 Marginal corneal ulcer, right eye: Secondary | ICD-10-CM | POA: Diagnosis not present

## 2024-07-18 DIAGNOSIS — H16041 Marginal corneal ulcer, right eye: Secondary | ICD-10-CM | POA: Diagnosis not present

## 2024-07-25 DIAGNOSIS — H16041 Marginal corneal ulcer, right eye: Secondary | ICD-10-CM | POA: Diagnosis not present

## 2024-08-02 DIAGNOSIS — H2000 Unspecified acute and subacute iridocyclitis: Secondary | ICD-10-CM | POA: Diagnosis not present

## 2024-08-12 DIAGNOSIS — J439 Emphysema, unspecified: Secondary | ICD-10-CM | POA: Diagnosis not present

## 2024-08-12 DIAGNOSIS — I10 Essential (primary) hypertension: Secondary | ICD-10-CM | POA: Diagnosis not present

## 2024-08-12 DIAGNOSIS — E7800 Pure hypercholesterolemia, unspecified: Secondary | ICD-10-CM | POA: Diagnosis not present

## 2024-08-16 ENCOUNTER — Other Ambulatory Visit: Payer: Self-pay | Admitting: Cardiology

## 2024-09-11 DIAGNOSIS — E7800 Pure hypercholesterolemia, unspecified: Secondary | ICD-10-CM | POA: Diagnosis not present

## 2024-09-11 DIAGNOSIS — J439 Emphysema, unspecified: Secondary | ICD-10-CM | POA: Diagnosis not present

## 2024-09-11 DIAGNOSIS — I10 Essential (primary) hypertension: Secondary | ICD-10-CM | POA: Diagnosis not present

## 2024-09-27 DIAGNOSIS — Z0000 Encounter for general adult medical examination without abnormal findings: Secondary | ICD-10-CM | POA: Diagnosis not present

## 2024-09-27 DIAGNOSIS — Z23 Encounter for immunization: Secondary | ICD-10-CM | POA: Diagnosis not present

## 2024-09-27 DIAGNOSIS — F17200 Nicotine dependence, unspecified, uncomplicated: Secondary | ICD-10-CM | POA: Diagnosis not present

## 2024-09-27 DIAGNOSIS — E7800 Pure hypercholesterolemia, unspecified: Secondary | ICD-10-CM | POA: Diagnosis not present

## 2024-09-27 DIAGNOSIS — R7303 Prediabetes: Secondary | ICD-10-CM | POA: Diagnosis not present

## 2024-09-27 DIAGNOSIS — I2510 Atherosclerotic heart disease of native coronary artery without angina pectoris: Secondary | ICD-10-CM | POA: Diagnosis not present

## 2024-09-27 DIAGNOSIS — I10 Essential (primary) hypertension: Secondary | ICD-10-CM | POA: Diagnosis not present

## 2024-09-27 DIAGNOSIS — E559 Vitamin D deficiency, unspecified: Secondary | ICD-10-CM | POA: Diagnosis not present

## 2024-09-27 DIAGNOSIS — J439 Emphysema, unspecified: Secondary | ICD-10-CM | POA: Diagnosis not present

## 2024-09-28 ENCOUNTER — Other Ambulatory Visit: Payer: Self-pay | Admitting: Family Medicine

## 2024-09-28 DIAGNOSIS — F17208 Nicotine dependence, unspecified, with other nicotine-induced disorders: Secondary | ICD-10-CM

## 2024-10-01 ENCOUNTER — Other Ambulatory Visit: Payer: Self-pay | Admitting: Family Medicine

## 2024-10-01 ENCOUNTER — Ambulatory Visit: Admitting: Podiatry

## 2024-10-01 ENCOUNTER — Ambulatory Visit (INDEPENDENT_AMBULATORY_CARE_PROVIDER_SITE_OTHER)

## 2024-10-01 DIAGNOSIS — L03031 Cellulitis of right toe: Secondary | ICD-10-CM

## 2024-10-01 DIAGNOSIS — M2031 Hallux varus (acquired), right foot: Secondary | ICD-10-CM

## 2024-10-01 DIAGNOSIS — N529 Male erectile dysfunction, unspecified: Secondary | ICD-10-CM | POA: Insufficient documentation

## 2024-10-01 DIAGNOSIS — R748 Abnormal levels of other serum enzymes: Secondary | ICD-10-CM | POA: Insufficient documentation

## 2024-10-01 DIAGNOSIS — Z720 Tobacco use: Secondary | ICD-10-CM | POA: Insufficient documentation

## 2024-10-01 DIAGNOSIS — F17200 Nicotine dependence, unspecified, uncomplicated: Secondary | ICD-10-CM

## 2024-10-01 DIAGNOSIS — J439 Emphysema, unspecified: Secondary | ICD-10-CM | POA: Insufficient documentation

## 2024-10-01 DIAGNOSIS — L97512 Non-pressure chronic ulcer of other part of right foot with fat layer exposed: Secondary | ICD-10-CM

## 2024-10-01 DIAGNOSIS — Z860100 Personal history of colon polyps, unspecified: Secondary | ICD-10-CM | POA: Insufficient documentation

## 2024-10-01 DIAGNOSIS — M79675 Pain in left toe(s): Secondary | ICD-10-CM

## 2024-10-01 DIAGNOSIS — B351 Tinea unguium: Secondary | ICD-10-CM

## 2024-10-01 DIAGNOSIS — M79674 Pain in right toe(s): Secondary | ICD-10-CM

## 2024-10-01 DIAGNOSIS — D729 Disorder of white blood cells, unspecified: Secondary | ICD-10-CM | POA: Insufficient documentation

## 2024-10-01 DIAGNOSIS — N400 Enlarged prostate without lower urinary tract symptoms: Secondary | ICD-10-CM | POA: Insufficient documentation

## 2024-10-01 DIAGNOSIS — E7800 Pure hypercholesterolemia, unspecified: Secondary | ICD-10-CM | POA: Insufficient documentation

## 2024-10-01 DIAGNOSIS — I2510 Atherosclerotic heart disease of native coronary artery without angina pectoris: Secondary | ICD-10-CM | POA: Insufficient documentation

## 2024-10-01 DIAGNOSIS — F1010 Alcohol abuse, uncomplicated: Secondary | ICD-10-CM | POA: Insufficient documentation

## 2024-10-01 DIAGNOSIS — I10 Essential (primary) hypertension: Secondary | ICD-10-CM | POA: Insufficient documentation

## 2024-10-01 MED ORDER — MUPIROCIN 2 % EX OINT: 1.0000 | TOPICAL_OINTMENT | Freq: Two times a day (BID) | CUTANEOUS | 2 refills | Status: DC

## 2024-10-01 MED ORDER — DOXYCYCLINE HYCLATE 100 MG PO TABS: 100.0000 mg | ORAL_TABLET | Freq: Two times a day (BID) | ORAL | 0 refills | Status: DC

## 2024-10-01 NOTE — Progress Notes (Signed)
  Subjective:  Patient ID: Sean Sanders, male    DOB: 06/17/1941,  MRN: [id number]  Chief Complaint  Patient presents with   Toe Pain    Right great toe wound      Discussed the use of AI scribe software for clinical note transcription with the patient, who gave verbal consent to proceed.  History of Present Illness Nathen Balaban is an 83 year old male who presents with a wound on the right big toe.  The wound on the right big toe originated from an injury during a volleyball game in middle school. It was asymptomatic for years but has recently become painful and increased in size. There is no drainage, fever, or chills. Pain occurs with pressure from certain shoes and disrupts sleep.  He smokes a pack of cigarettes daily. There is no diabetes, numbness, tingling in the feet, or leg cramps while walking. He experiences sciatic nerve pain affecting his back and legs.      Objective:  There were no vitals filed for this visit.  Physical Exam General: AAO x3, NAD  Dermatological: On the dorsal aspect of the right hallux IPJ is a full-thickness ulceration however there is no probing, undermining or tunneling.  There is no probing to bone.  Measured approximately 0.5 cm in diameter.  Localized edema erythema to the hallux without any ascending status.  There is no fluctuation or crepitation.  There is no malodor.  No other open lesions.  Vascular: Dorsalis Pedis artery and Posterior Tibial artery pedal pulses are 1/4 bilateral with immedate capillary fill time.  There is no pain with calf compression, swelling, warmth, erythema.   Neruologic: Grossly intact via light touch bilateral.   Musculoskeletal: Hallux malleus is noted on the right hallux.         Results RADIOLOGY Foot X-ray: Arthritic changes present.  Note is no definitive focal changes suggest osteomyelitis.  There is no soft tissue emphysema.  Dorsal spurring present on the hallux.   Assessment:   1. Ulcer of  great toe, right, with fat layer exposed (HCC)   2. Cellulitis of toe, right   3. Dermatophytosis of nail   4. Pain in toes of both feet   5. Hallux malleus, right      Plan:  Patient was evaluated and treated and all questions answered.  Assessment and Plan Assessment & Plan Infected chronic ulcer of right great toe - Prescribe oral doxycycline for 10 days. - Prescribe mupirocin ointment twice daily. - Instruct to wash, dry, apply ointment, and change dressing daily. - Provide surgical shoe to decrease pressure - Order circulation test to ensure adequate blood flow. - Advise to report increased swelling, redness, fevers, or chills. - Discussed risk of amputation.  Decreased circulation to feet Decreased pulses possibly delaying ulcer healing. Smoking may worsen circulation issues. - Order ultrasound of legs to assess blood flow. - Advise to monitor for leg cramps or increased ulcer symptoms.  Nicotine dependence, cigarettes Smoking one pack per day may contribute to decreased circulation and delayed healing.   Return in about 10 days (around 10/11/2024) for right big toe infection, x-ray.   Donnice JONELLE Fees DPM

## 2024-10-01 NOTE — Patient Instructions (Signed)
 Wound with soap and water daily, dry thoroughly.  Apply a small amount of mupirocin ointment followed by bandage.  Change twice a day.  Start doxycycline  Wear surgical shoe  Monitor for any signs/symptoms of infection. Call the office immediately if any occur or go directly to the emergency room. Call with any questions/concerns.

## 2024-10-09 ENCOUNTER — Ambulatory Visit
Admission: RE | Admit: 2024-10-09 | Discharge: 2024-10-09 | Disposition: A | Discharge status: Home / Self Care | Source: Ambulatory Visit | Attending: Family Medicine | Admitting: Family Medicine

## 2024-10-09 DIAGNOSIS — J439 Emphysema, unspecified: Secondary | ICD-10-CM | POA: Diagnosis not present

## 2024-10-09 DIAGNOSIS — F17200 Nicotine dependence, unspecified, uncomplicated: Secondary | ICD-10-CM

## 2024-10-09 DIAGNOSIS — J984 Other disorders of lung: Secondary | ICD-10-CM | POA: Diagnosis not present

## 2024-10-12 ENCOUNTER — Ambulatory Visit

## 2024-10-12 ENCOUNTER — Ambulatory Visit: Admitting: Podiatry

## 2024-10-12 DIAGNOSIS — E7800 Pure hypercholesterolemia, unspecified: Secondary | ICD-10-CM | POA: Diagnosis not present

## 2024-10-12 DIAGNOSIS — I10 Essential (primary) hypertension: Secondary | ICD-10-CM | POA: Diagnosis not present

## 2024-10-12 DIAGNOSIS — L97512 Non-pressure chronic ulcer of other part of right foot with fat layer exposed: Secondary | ICD-10-CM

## 2024-10-12 DIAGNOSIS — J439 Emphysema, unspecified: Secondary | ICD-10-CM | POA: Diagnosis not present

## 2024-10-12 NOTE — Patient Instructions (Addendum)
 Wash the wound with soap and water. Dry well. Apply a small piece of Hydrofera Blue foam dressing to the wound followed by gauze and wrap the toe.  Change daily.  Continue with surgical shoe.  Monitor for any signs/symptoms of infection. Call the office immediately if any occur or go directly to the emergency room. Call with any questions/concerns.

## 2024-10-12 NOTE — Progress Notes (Addendum)
  Subjective:  Patient ID: Sean Sanders, male    DOB: 1941-06-11,  MRN: [id number]  Chief Complaint  Patient presents with   Foot Pain   Foot Ulcer    Ulcer of great toe, right, with fat layer exposed (HCC)     History of Present Illness Sean Sanders is an 83 year old male who presents with a wound on the right big toe.  He states that overall he is doing better is not hurting like it was previously.  He has not seen any drainage or pus.  Is no longer covering the wound.  He does not Dors any fevers or chills.  He still wearing the surgical shoe.  He has no other concerns today.   He smokes a pack of cigarettes daily. There is no diabetes, numbness, tingling in the feet, or leg cramps while walking. He experiences sciatic nerve pain affecting his back and legs.      Objective:   Physical Exam General: AAO x3, NAD  Dermatological: On the dorsal aspect of the right hallux IPJ an ulceration however there is no probing, undermining or tunneling.  There is no probing to bone.  Measured approximately 0.5 cm in diameter.  Wound appears to be more superficial and starting to scab over.  There was hair,, debris present within the wound.  Remove erythema there is no ascending cellulitis but there is no drainage or pus.  No fluctuation or crepitation.  There is no malodor.  Vascular: Dorsalis Pedis artery and Posterior Tibial artery pedal pulses are 1/4 bilateral with immedate capillary fill time.  There is no pain with calf compression, swelling, warmth, erythema.   Neruologic: Grossly intact via light touch bilateral.   Musculoskeletal: Hallux malleus is noted on the right hallux.         Results RADIOLOGY Foot X-ray: Arthritic changes present.  Note is no definitive focal changes suggest osteomyelitis.  There is no soft tissue emphysema.  Dorsal spurring present on the hallux.  No significant changes compared to prior.   Assessment:   1. Ulcer of great toe, right, with fat layer  exposed (HCC)     Plan:  Patient was evaluated and treated and all questions answered.  Assessment and Plan Assessment & Plan Infected chronic ulcer of right great toe - Completed course of doxycycline - Monitor for any clinical signs or symptoms of infection and directed to call the office immediately should any occur or go to the ER. - I sharply debrided the wound today without any complications or bleeding.  I debrided the hyperkeratotic periwound and #3 filled with scalpel.  The wound pre and post measurements were the same.  Hydrofera Blue was applied followed by dressing.  Discussed with them daily dressing changes.  Discussed washing with soap and water and keeping a bandage on the toe. - Continue surgical shoe for offloading.  Decreased circulation to feet Decreased pulses possibly delaying ulcer healing. Smoking may worsen circulation issues. - ABI ordered  Nicotine dependence, cigarettes Smoking one pack per day may contribute to decreased circulation and delayed healing.   Return for ulcer in 2-3 weeks.   Donnice JONELLE Fees DPM

## 2024-10-24 ENCOUNTER — Ambulatory Visit (HOSPITAL_COMMUNITY)

## 2024-10-25 ENCOUNTER — Ambulatory Visit (HOSPITAL_COMMUNITY)
Admission: RE | Admit: 2024-10-25 | Discharge: 2024-10-25 | Disposition: A | Discharge status: Home / Self Care | Source: Ambulatory Visit | Attending: Podiatry | Admitting: Podiatry

## 2024-10-25 DIAGNOSIS — L97512 Non-pressure chronic ulcer of other part of right foot with fat layer exposed: Secondary | ICD-10-CM | POA: Insufficient documentation

## 2024-10-25 LAB — VAS US ABI WITH/WO TBI
Left ABI: 0.67
Right ABI: 0.63

## 2024-10-29 ENCOUNTER — Ambulatory Visit: Payer: Self-pay | Admitting: Podiatry

## 2024-10-29 ENCOUNTER — Other Ambulatory Visit: Payer: Self-pay | Admitting: Podiatry

## 2024-10-29 DIAGNOSIS — I739 Peripheral vascular disease, unspecified: Secondary | ICD-10-CM

## 2024-10-30 ENCOUNTER — Encounter: Payer: Self-pay | Admitting: Pulmonary Disease

## 2024-11-02 ENCOUNTER — Encounter: Payer: Self-pay | Admitting: Podiatry

## 2024-11-02 ENCOUNTER — Ambulatory Visit: Admitting: Podiatry

## 2024-11-02 DIAGNOSIS — L97511 Non-pressure chronic ulcer of other part of right foot limited to breakdown of skin: Secondary | ICD-10-CM | POA: Diagnosis not present

## 2024-11-02 DIAGNOSIS — I739 Peripheral vascular disease, unspecified: Secondary | ICD-10-CM

## 2024-11-02 NOTE — Progress Notes (Signed)
  Subjective:  Patient ID: Sean Sanders, male    DOB: Apr 07, 1941,  MRN: [id number]  Chief Complaint  Patient presents with   Foot Ulcer    Right foot great toe hammertoe. Dorsal ulcer. 0 pain. Non diabetic. Dressing change with gauze pad.     History of Present Illness Sean Sanders is an 83 year old male who presents with a wound on the right big toe.  He is continue with Hydrofera Blue dressing changes daily.  He has not seen any drainage or purulence.  He has no pain.  No recent changes.  No new concerns.   He smokes a pack of cigarettes daily. There is no diabetes, numbness, tingling in the feet, or leg cramps while walking. He experiences sciatic nerve pain affecting his back and legs.      Objective:   Physical Exam General: AAO x3, NAD-wearing surgical shoe  Dermatological: On the dorsal aspect of the right hallux IPJ is a hyperkeratotic lesion.  Upon debridement it appears the wound is doing better and almost healed.  There is no drainage or pus.  There is no fluctuation or crepitation.  There is no malodor.  No new ulcerations are identified at this time.   Vascular: Dorsalis Pedis artery and Posterior Tibial artery pedal pulses are 1/4 bilateral with immedate capillary fill time.  There is no pain with calf compression, swelling, warmth, erythema.   Neruologic: Grossly intact via light touch bilateral.   Musculoskeletal: Hallux malleus is noted on the right hallux.       Assessment:   1. Ulcer of great toe, right, with fat layer exposed (HCC)     Plan:  Patient was evaluated and treated and all questions answered.  Assessment and Plan Assessment & Plan Chronic ulcer of right great toe - Sharply pared the hyperkeratotic lesion today and complications or bleeding.  Wound is almost healed.  Recommend continue with Hydrofera Blue dressing changes daily which I applied today again and the remainder to use daily.  Recommend remain in surgical shoe for now and so  there is completely healed.  Long-term will need to work on shoes  better extra-depth or double depth. -Follow-up with vascular surgery  Return in about 3 weeks (around 11/23/2024) for toe ulcer.  Sean Sanders DPM

## 2024-11-05 ENCOUNTER — Ambulatory Visit (INDEPENDENT_AMBULATORY_CARE_PROVIDER_SITE_OTHER): Admitting: Acute Care

## 2024-11-05 ENCOUNTER — Encounter: Payer: Self-pay | Admitting: Acute Care

## 2024-11-05 VITALS — BP 136/73 | HR 98 | Temp 95.0°F | Ht 67.0 in | Wt 158.6 lb

## 2024-11-05 DIAGNOSIS — F17210 Nicotine dependence, cigarettes, uncomplicated: Secondary | ICD-10-CM

## 2024-11-05 DIAGNOSIS — R911 Solitary pulmonary nodule: Secondary | ICD-10-CM

## 2024-11-05 DIAGNOSIS — F17200 Nicotine dependence, unspecified, uncomplicated: Secondary | ICD-10-CM

## 2024-11-05 DIAGNOSIS — R9389 Abnormal findings on diagnostic imaging of other specified body structures: Secondary | ICD-10-CM | POA: Diagnosis not present

## 2024-11-05 NOTE — Progress Notes (Signed)
 History of Present Illness Sean Sanders is a 83 y.o. male current every day smoker with a 42 pack year smoking history referred by Dr.Welborne  for abnormal chest imaging. Pt. Has been screening through the screening program. Last scan was 12/2022.    11/05/2024 Discussed the use of AI scribe software for clinical note transcription with the patient, who gave verbal consent to proceed.  History of Present Illness Sean Sanders is an 83 year old male current every day smoker who presents for evaluation of a lung nodule.  Patient's left upper lobe  lung nodule was initially identified during a lung cancer screening scan in January 2024, measuring 13 millimeters, having grown from 10.9 millimeters since a previous scan in 2022. The nodule is partially solid. No unintentional weight loss, hemoptysis, or significant changes in cough or breathing. Breathing is described as 'okay' with only an occasional cough.  He has a significant smoking history, currently smoking one pack per day. He smells heavily of cigarette smoke.He began smoking in his twenties, quit in his early thirties, and resumed smoking approximately 30 years ago, resulting in a 42 pack-year history. He lives in [place] with his dog and is retired from a career as a best boy. He denies any significant family history of cancer or exposure to chemical hazards at work.  No significant respiratory symptoms such as hemoptysis or significant changes in cough or breathing. He had a normal echocardiogram in 2022. He has received his flu shot.Current CT Chest does show an enlarged pulmonary artery. He is followed by cardiology.  He does not have any desire to quit smoking. I have provided him with several numbers and resources to help him quit if he changes his mind.      Test Results: CT Chest 10/09/2024  Stable subsolid left upper lobe nodule measuring 13 x 23 mm with slow interval growth since 2018 (previously 8 x 11 mm),  consistent with a lesion along the adenocarcinoma spectrum; no solid component identified at this time. 2. Mild emphysema. 3. Enlarged central pulmonary arteries consistent with pulmonary arterial hypertension. 4. Extensive multivessel coronary artery calcification and aortic valve leaflet calcification.     No data to display              No data to display          BNP No results found for: BNP  ProBNP No results found for: PROBNP  PFT No results found for: FEV1PRE, FEV1POST, FVCPRE, FVCPOST, TLC, DLCOUNC, PREFEV1FVCRT, PSTFEV1FVCRT  VAS US  ABI WITH/WO TBI Result Date: 10/25/2024  LOWER EXTREMITY DOPPLER STUDY Patient Name:  Sean Sanders  Date of Exam:   10/25/2024 Medical Rec #: [id number]      Accession #:    [id number] Date of Birth: August 21, 1941      Patient Gender: M Patient Age:   58 years Exam Location:  [address] Procedure:      VAS US  ABI WITH/WO TBI Referring Phys: DONNICE FEES --------------------------------------------------------------------------------  Indications: Ulceration.  Performing Technologist: [place] RVT  Examination Guidelines: A complete evaluation includes at minimum, Doppler waveform signals and systolic blood pressure reading at the level of bilateral brachial, anterior tibial, and posterior tibial arteries, when vessel segments are accessible. Bilateral testing is considered an integral part of a complete examination. Photoelectric Plethysmograph (PPG) waveforms and toe systolic pressure readings are included as required and additional duplex testing as needed. Limited examinations for reoccurring indications may be performed as noted.  ABI Findings: +---------+------------------+-----+----------+--------+ Right  Rt Pressure (mmHg)IndexWaveform  Comment  +---------+------------------+-----+----------+--------+ Brachial 136                                        +---------+------------------+-----+----------+--------+ ATA      74                0.52 monophasic         +---------+------------------+-----+----------+--------+ PTA      89                0.63 monophasic         +---------+------------------+-----+----------+--------+ Great Toe0                 0.00                    +---------+------------------+-----+----------+--------+ +---------+------------------+-----+----------+-------+ Left     Lt Pressure (mmHg)IndexWaveform  Comment +---------+------------------+-----+----------+-------+ Brachial 141                                      +---------+------------------+-----+----------+-------+ ATA      90                0.64 monophasic        +---------+------------------+-----+----------+-------+ PTA      95                0.67 monophasic        +---------+------------------+-----+----------+-------+ Great Toe45                0.32                   +---------+------------------+-----+----------+-------+ +-------+-----------+-----------+------------+------------+ ABI/TBIToday's ABIToday's TBIPrevious ABIPrevious TBI +-------+-----------+-----------+------------+------------+ Right  0.63       0                                   +-------+-----------+-----------+------------+------------+ Left   0.67       0.32                                +-------+-----------+-----------+------------+------------+  TOES Findings: +----------+---------------+--------+-------+ Right ToesPressure (mmHg)WaveformComment +----------+---------------+--------+-------+ 1st Digit                Absent          +----------+---------------+--------+-------+ 2nd Digit                Absent          +----------+---------------+--------+-------+ 3rd Digit                Absent          +----------+---------------+--------+-------+ 4th Digit                Absent           +----------+---------------+--------+-------+ 5th Digit                present         +----------+---------------+--------+-------+  +---------+---------------+--------+-------+ Left ToesPressure (mmHg)WaveformComment +---------+---------------+--------+-------+ 1st Digit               Present         +---------+---------------+--------+-------+ 2nd Digit               Present         +---------+---------------+--------+-------+  3rd Digit               Present         +---------+---------------+--------+-------+ 4th Digit               Present         +---------+---------------+--------+-------+ 5th Digit               Present         +---------+---------------+--------+-------+   No previous ABI.  Summary: Right: Resting right ankle-brachial index indicates moderate right lower extremity arterial disease. The right toe-brachial index is abnormal.  Left: Resting left ankle-brachial index indicates moderate left lower extremity arterial disease. The left toe-brachial index is abnormal.  *See table(s) above for measurements and observations.  Electronically signed by Dorn Lesches MD on 10/25/2024 at 4:30:41 PM.    Final    DG Foot Complete Right Result Date: 10/24/2024 Please see detailed radiograph report in office note.  CT CHEST WO CONTRAST Result Date: 10/10/2024 EXAM: CT CHEST WITHOUT CONTRAST 10/09/2024 12:45:53 PM TECHNIQUE: CT of the chest was performed without the administration of intravenous contrast. Multiplanar reformatted images are provided for review. Automated exposure control, iterative reconstruction, and/or weight based adjustment of the mA/kV was utilized to reduce the radiation dose to as low as reasonably achievable. COMPARISON: CT Chest 12/14/2022. CLINICAL HISTORY: Difficulty in breathing, history of smoking. FINDINGS: MEDIASTINUM: Extensive multivessel coronary artery calcification. Calcification of the aortic valve leaflets. Cardiac size within  normal limits. No pericardial effusion. The central pulmonary arteries are enlarged in keeping with changes of pulmonary arterial hypertension. Moderate atherosclerotic calcification within the thoracic aorta. No aortic aneurysm. The central airways are clear. LYMPH NODES: No mediastinal, hilar or axillary lymphadenopathy. LUNGS AND PLEURA: Mild emphysema. Stable subsolid nodule within the left upper lobe, image 79/5, measuring 13 x 23 mm. This nodule measured 8 x 11 mm on a remote prior examination of 04/18/2017. Its slow interval growth indicates a lesion along the adenocarcinoma spectrum. However, no solid component is identified at this time. No new focal pulmonary nodules or infiltrates. No pneumothorax or pleural effusion. SOFT TISSUES/BONES: No acute abnormality of the bones or soft tissues. UPPER ABDOMEN: Limited images of the upper abdomen demonstrates no acute abnormality. IMPRESSION: 1. Stable subsolid left upper lobe nodule measuring 13 x 23 mm with slow interval growth since 2018 (previously 8 x 11 mm), consistent with a lesion along the adenocarcinoma spectrum; no solid component identified at this time. 2. Mild emphysema. 3. Enlarged central pulmonary arteries consistent with pulmonary arterial hypertension. 4. Extensive multivessel coronary artery calcification and aortic valve leaflet calcification. Electronically signed by: Dorethia Molt MD 10/10/2024 11:36 PM EDT RP Workstation: [id number]     Past medical hx History reviewed. No pertinent past medical history.   Social History   Tobacco Use   Smoking status: Every Day    Current packs/day: 1.00    Average packs/day: 1 pack/day for 61.9 years (61.9 ttl pk-yrs)    Types: Cigarettes    Start date: 1964   Smokeless tobacco: Never   Tobacco comments:    1 pack a day 11/05/2024 KRD    Mr.Jungman reports that he has been smoking cigarettes. He started smoking about 61 years ago. He has a 61.9 pack-year smoking history. He has never  used smokeless tobacco.  Tobacco Cessation: Ready to quit: Not Answered Counseling given: Not Answered Tobacco comments: 1 pack a day 11/05/2024 KRD Current every day smoker  Counseling x 3 minutes to quit  Past surgical hx, Family hx, Social hx all reviewed.  Current Outpatient Medications on File Prior to Visit  Medication Sig   amLODipine (NORVASC) 5 MG tablet    Ascorbic Acid (VITAMIN C WITH ROSE HIPS) 500 MG tablet Take 500 mg by mouth daily.   aspirin EC 81 MG tablet Take 81 mg by mouth daily. Swallow whole.   benazepril (LOTENSIN) 10 MG tablet benazepril 10 mg tablet   cholecalciferol (VITAMIN D3) 25 MCG (1000 UNIT) tablet Take 1,000 Units by mouth daily.   cyanocobalamin 100 MCG tablet Take 100 mcg by mouth daily.   Multiple Vitamin (MULTIVITAMIN) tablet Take 1 tablet by mouth daily.   Omega-3 Fatty Acids (FISH OIL) 1000 MG CAPS Take by mouth.   rosuvastatin  (CRESTOR ) 10 MG tablet Take 1 tablet (10 mg total) by mouth daily.   No current facility-administered medications on file prior to visit.     No Known Allergies  Review Of Systems:  Constitutional:   No  weight loss, night sweats,  Fevers, chills, fatigue, or  lassitude.  HEENT:   No headaches,  Difficulty swallowing,  Tooth/dental problems, or  Sore throat,                No sneezing, itching, ear ache, nasal congestion, post nasal drip,   CV:  No chest pain,  Orthopnea, PND, swelling in lower extremities, anasarca, dizziness, palpitations, syncope.   GI  No heartburn, indigestion, abdominal pain, nausea, vomiting, diarrhea, change in bowel habits, loss of appetite, bloody stools.   Resp: No shortness of breath with exertion or at rest.  No excess mucus, no productive cough,  No non-productive cough,  No coughing up of blood.  No change in color of mucus.  No wheezing.  No chest wall deformity  Skin: no rash or lesions.  GU: no dysuria, change in color of urine, no urgency or frequency.  No flank pain, no  hematuria   MS:  No joint pain or swelling.  No decreased range of motion.  No back pain.  Psych:  No change in mood or affect. No depression or anxiety.  No memory loss.   Vital Signs BP 136/73   Pulse 98   Temp (!) 95 F (35 C) (Oral)   Ht 5' 7 (1.702 m)   Wt 158 lb 9.6 oz (71.9 kg)   SpO2 99%   BMI 24.84 kg/m    Physical Exam:   Physical Exam GENERAL: No distress, alert and oriented times 3. EARS NOSE THROAT: No sinus tenderness, tympanic membranes clear, pale nasal mucosa, no oral exudate, no post nasal drip, no lymphadenopathy. CHEST: Wheezing present, no rales, dullness, no accessory muscle use, no nasal flaring, no sternal retractions. CARDIAC: S1, S2, regular rate and rhythm, no murmur. ABDOMINAL: Soft, non tender. ND, BS present. EXTREMITIES: No clubbing, cyanosis, edema. No obvious deformities. NEUROLOGICAL: Normal strength. Alert and oriented x 3, MAE x 4. SKIN: No rashes, warm and dry. No obvious skin lesions. PSYCHIATRIC: Normal mood and behavior.   Assessment/Plan Assessment & Plan Solitary pulmonary nodule, increasing in size Nodule increased from 10.9 mm to 13 mm with partially solid component. High risk for malignancy due to 42 pack-year smoking history, concern for adenocarcinoma. - Ordered follow-up chest CT in 3 months to assess nodule size and solidity. -This will be due 01/2025 - Advised to report new symptoms such as hemoptysis or unexplained weight loss to be seen sooner.  Suspected pulmonary hypertension Pulmonary artery enlargement on imaging suggests possible pulmonary  hypertension.  Previous echocardiogram in 2022 showed normal pressures. - Sent imaging results to cardiologist Dr. Adron for review.  Tobacco use disorder 42 pack-year smoking history, currently smokes one pack per day, no desire to quit. Discussed risks of smoking and vaping. - Offered pulmonary function testing for COPD evaluation, declined. - Provided phone numbers for  smoking cessation resources. - Offered nicotine replacement therapy options such as patches, gum, or mints.  AVS 11/05/2024 We have reviewed your CT Chest. The nodule in the left upper lobe does show slow continued growth, but it is not a solid nodule. We will do a 3 month follow up scan. You will get a call to get this scheduled . You will follow up with me 1-2 weeks after the scan to review results. You can receive free nicotine replacement therapy (patches, gum, or mints) by calling 1-800-QUIT NOW. Please call so we can get you on the path to becoming a non-smoker. I know it is hard, but you can do this!  Hypnosis for smoking cessation  [employer] [phone]  Acupuncture for smoking cessation  [employer] [phone]    Have a good holiday. Please contact office for sooner follow up if symptoms do not improve or worsen or seek emergency care     I spent 25 minutes dedicated to the care of this patient on the date of this encounter to include pre-visit review of records, face-to-face time with the patient discussing conditions above, post visit ordering of testing, clinical documentation with the electronic health record, making appropriate referrals as documented, and communicating necessary information to the patient's healthcare team.      Lauraine JULIANNA Lites, NP 11/05/2024  8:57 AM

## 2024-11-05 NOTE — Patient Instructions (Addendum)
 It is good to see you today. We have reviewed your CT Chest. The nodule in the left upper lobe does show slow continued growth, but it is not a solid nodule. We will do a 3 month follow up scan. You will get a call to get this scheduled . You will follow up with me 1-2 weeks after the scan to review results. You can receive free nicotine replacement therapy (patches, gum, or mints) by calling 1-800-QUIT NOW. Please call so we can get you on the path to becoming a non-smoker. I know it is hard, but you can do this!  Hypnosis for smoking cessation  [employer] [phone]  Acupuncture for smoking cessation  [employer] [phone]    Have a good holiday. Please contact office for sooner follow up if symptoms do not improve or worsen or seek emergency care

## 2024-11-11 DIAGNOSIS — E7800 Pure hypercholesterolemia, unspecified: Secondary | ICD-10-CM | POA: Diagnosis not present

## 2024-11-11 DIAGNOSIS — J439 Emphysema, unspecified: Secondary | ICD-10-CM | POA: Diagnosis not present

## 2024-11-11 DIAGNOSIS — I10 Essential (primary) hypertension: Secondary | ICD-10-CM | POA: Diagnosis not present

## 2024-11-14 ENCOUNTER — Ambulatory Visit: Admitting: Vascular Surgery

## 2024-11-15 ENCOUNTER — Encounter: Payer: Self-pay | Admitting: Vascular Surgery

## 2024-11-15 ENCOUNTER — Ambulatory Visit: Attending: Vascular Surgery | Admitting: Vascular Surgery

## 2024-11-15 VITALS — BP 129/70 | HR 93 | Temp 98.5°F | Resp 22 | Ht 67.0 in | Wt 158.0 lb

## 2024-11-15 DIAGNOSIS — I70235 Atherosclerosis of native arteries of right leg with ulceration of other part of foot: Secondary | ICD-10-CM | POA: Diagnosis not present

## 2024-11-15 NOTE — Progress Notes (Signed)
 Office Note     CC: Right great toe wound Requesting Provider:  Dayna Motto, DO  HPI: Sean Sanders is a 83 y.o. (08/20/1941) male presenting at the request of Dr. Gershon for right great toe wound and nonpalpable pulse.  On exam, Sean Sanders was doing well.  A native of [place] , he moved to [place]  for his job.  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  His wife has unfortunately passed away he continues to live independently with his dog.  His son lives in [place].  He just saw him over the Thanksgiving holiday.  Sean Sanders states that he has had a right great toe room for roughly 1 month.  The toe wound arose from rubbing on his shoe due to having a hammertoe.  He has seen Dr. Gershon who did not appreciate a palpable pulse.  [name] thinks that the toe wound is healing slowly, and looks much better than previous.  Denies symptoms of claudication, ischemic rest pain, tissue loss arrives today in a wheelchair because it was offered.  Usually ambulates with a cane.  Daily smoker 1 pack/day ASA, statin  Past Medical History:  Diagnosis Date   Hyperlipidemia    Hypertension    Peripheral vascular disease     Past Surgical History:  Procedure Laterality Date   HERNIA REPAIR     VASECTOMY      Social History   Socioeconomic History   Marital status: Widowed    Spouse name: Not on file   Number of children: Not on file   Years of education: Not on file   Highest education level: Not on file  Occupational History   Not on file  Tobacco Use   Smoking status: Every Day    Current packs/day: 1.00    Average packs/day: 1 pack/day for 61.9 years (61.9 ttl pk-yrs)    Types: Cigarettes    Start date: 1964   Smokeless tobacco: Never   Tobacco comments:    1 pack a day 11/05/2024 KRD  Vaping Use   Vaping status: Never Used  Substance and Sexual Activity   Alcohol use: Yes    Alcohol/week: 1.0 standard  drink of alcohol    Types: 1 Cans of beer per week   Drug use: Never   Sexual activity: Not Currently  Other Topics Concern   Not on file  Social History Narrative   Not on file   Social Drivers of Health   Financial Resource Strain: Not on file  Food Insecurity: Not on file  Transportation Needs: Not on file  Physical Activity: Not on file  Stress: Not on file  Social Connections: Not on file  Intimate Partner Violence: Not on file   History reviewed. No pertinent family history.  Current Outpatient Medications  Medication Sig Dispense Refill   amLODipine (NORVASC) 5 MG tablet      Ascorbic Acid (VITAMIN C WITH ROSE HIPS) 500 MG tablet Take 500 mg by mouth daily.     aspirin EC 81 MG tablet Take 81 mg by mouth daily. Swallow whole.     benazepril (LOTENSIN) 10 MG tablet benazepril 10 mg tablet     cholecalciferol (VITAMIN D3) 25 MCG (1000 UNIT) tablet Take 1,000 Units by mouth daily.     cyanocobalamin 100 MCG tablet Take 100 mcg by mouth daily.     Multiple Vitamin (MULTIVITAMIN) tablet Take 1 tablet by mouth daily.     Omega-3 Fatty  Acids (FISH OIL) 1000 MG CAPS Take by mouth.     rosuvastatin  (CRESTOR ) 10 MG tablet Take 1 tablet (10 mg total) by mouth daily. 90 tablet 2   No current facility-administered medications for this visit.    No Known Allergies   REVIEW OF SYSTEMS:   [X]  denotes positive finding, [ ]  denotes negative finding Cardiac  Comments:  Chest pain or chest pressure:    Shortness of breath upon exertion:    Short of breath when lying flat:    Irregular heart rhythm:        Vascular    Pain in calf, thigh, or hip brought on by ambulation:    Pain in feet at night that wakes you up from your sleep:     Blood clot in your veins:    Leg swelling:         Pulmonary    Oxygen at home:    Productive cough:     Wheezing:         Neurologic    Sudden weakness in arms or legs:     Sudden numbness in arms or legs:     Sudden onset of difficulty  speaking or slurred speech:    Temporary loss of vision in one eye:     Problems with dizziness:         Gastrointestinal    Blood in stool:     Vomited blood:         Genitourinary    Burning when urinating:     Blood in urine:        Psychiatric    Major depression:         Hematologic    Bleeding problems:    Problems with blood clotting too easily:        Skin    Rashes or ulcers:        Constitutional    Fever or chills:      PHYSICAL EXAMINATION:  There were no vitals filed for this visit.  General:  WDWN in NAD; vital signs documented above Gait: Not observed HENT: WNL, normocephalic Pulmonary: normal non-labored breathing , without wheezing Cardiac: regular HR Abdomen: soft, NT, no masses Skin: without rashes Vascular Exam/Pulses:  Right Left  Radial 2+ (normal) 2+ (normal)  Ulnar    Femoral absent 2+ (normal)  Popliteal    DP absent absent  PT     Extremities: without ischemic changes, without Gangrene , without cellulitis; without open wounds;  Musculoskeletal: no muscle wasting or atrophy  Neurologic: A&O X 3;  No focal weakness or paresthesias are detected Psychiatric:  The pt has Normal affect.   Non-Invasive Vascular Imaging:     ABI Findings:  +---------+------------------+-----+----------+--------+  Right   Rt Pressure (mmHg)IndexWaveform  Comment   +---------+------------------+-----+----------+--------+  Brachial 136                                        +---------+------------------+-----+----------+--------+  ATA     74                0.52 monophasic          +---------+------------------+-----+----------+--------+  PTA     89                0.63 monophasic          +---------+------------------+-----+----------+--------+  Burnetta Heck  0.00                     +---------+------------------+-----+----------+--------+   +---------+------------------+-----+----------+-------+  Left     Lt Pressure (mmHg)IndexWaveform  Comment  +---------+------------------+-----+----------+-------+  Brachial 141                                       +---------+------------------+-----+----------+-------+  ATA     90                0.64 monophasic         +---------+------------------+-----+----------+-------+  PTA     95                0.67 monophasic         +---------+------------------+-----+----------+-------+  Great Toe45                0.32                    +---------+------------------+-----+----------+-------+      ASSESSMENT/PLAN: Juddson Cobern is a 83 y.o. male presenting with right lower extremity critical ischemia with tissue loss at the great toe.    On physical exam, the wound is small, 2 x 2 mm.  There is an eschar on top.  The wound appears smaller than it did on 10/01/2024 when he first saw Dr. Alona.  In looking back at pictures on 11/02/2024, the wound also appears to be healing.  On today's exam, there is only a very very small eschar.  I elected not to remove it.  Imaging was reviewed demonstrating depressed toe pressures bilaterally.  I do not think that a toe pressure was placed on the great toe on the right as he still had a bandage in place.  Regardless, toe pressure was 0, which is concerning.  I had a nice conversation with Ron regarding the above, most notably that with a toe pressure of 0, I am very concerned about the wound, and the possibility that the wound could significantly worsen.  He was adamant that the wound was healing.  When evaluating the pictures, I agree with him.   With a nonpalpable pulse in the right groin, we discussed return visit in 1 month's time to assess the wound.  If the wound is healed great if not, we would discuss intervention to improve flow.  In the interim, I have ordered a CT angio abdomen pelvis with runoff to further define what I believe is multilevel occlusive disease in the setting of a  nonpalpable common femoral artery pulse.  After discussing the risks and benefits of the above, [name] elected to proceed.  I asked him to call my office immediately should the toe wound worsen in any way  We discussed the importance of smoking cessation versus cutting back.  He was unaware that smoking affected wound healing.   Fonda FORBES Rim, MD [facility] [phone]

## 2024-11-19 ENCOUNTER — Other Ambulatory Visit: Payer: Self-pay

## 2024-11-19 DIAGNOSIS — I70235 Atherosclerosis of native arteries of right leg with ulceration of other part of foot: Secondary | ICD-10-CM

## 2024-11-29 ENCOUNTER — Encounter: Payer: Self-pay | Admitting: Podiatry

## 2024-11-29 ENCOUNTER — Ambulatory Visit

## 2024-11-29 ENCOUNTER — Ambulatory Visit: Admitting: Podiatry

## 2024-11-29 DIAGNOSIS — L97511 Non-pressure chronic ulcer of other part of right foot limited to breakdown of skin: Secondary | ICD-10-CM

## 2024-12-02 NOTE — Progress Notes (Signed)
"  °  Subjective:  Patient ID: Sean Sanders, male    DOB: 21-Feb-1941,  MRN: [id number]  Chief Complaint  Patient presents with   Foot Ulcer    Rm12 right foot big toe ulceration f/u / patient says he is doing well and wound looks like its healing.     History of Present Illness Sean Sanders is an 83 year old male who presents with a wound on the right big toe.  States he is doing better.  He has follow-up with vascular surgery as well.  He is continue to [facility] dressing changes.  He does not report any drainage or pus.  No fevers or chills any other concerns.      Objective:   Physical Exam General: AAO x3, NAD-wearing surgical shoe  Dermatological: On the dorsal aspect of the right hallux IPJ is a hyperkeratotic lesion.  Area is preulcerative but there is no definitive skin breakdown identified this time.  No edema, erythema, drainage or pus or any signs of infection.  No open lesions.    Vascular: Dorsalis Pedis artery and Posterior Tibial artery pedal pulses are 1/4 bilateral with immedate capillary fill time.  There is no pain with calf compression, swelling, warmth, erythema.   Neruologic: Grossly intact via light touch bilateral.   Musculoskeletal: Hallux malleus is noted on the right hallux.       Assessment:   1. Ulcer of great toe, right, with fat layer exposed (HCC)     Plan:  Patient was evaluated and treated and all questions answered.  Assessment and Plan Assessment & Plan Chronic ulcer of right great toe - Sharply pared the hyperkeratotic lesion today and complications or bleeding.  On the continue with Hydrofera Blue dressing changes and continue offloading surgical shoe see him back next appointment.  Continue to follow vascular surgery as well. Monitor for any clinical signs or symptoms of infection and directed to call the office immediately should any occur or go to the ER.  Return in about 3 weeks (around 12/20/2024) for toe ulcer.  Donnice JONELLE Fees DPM  "

## 2024-12-04 ENCOUNTER — Ambulatory Visit (HOSPITAL_COMMUNITY)
Admission: RE | Admit: 2024-12-04 | Discharge: 2024-12-04 | Disposition: A | Discharge status: Home / Self Care | Source: Ambulatory Visit | Attending: Internal Medicine | Admitting: Internal Medicine

## 2024-12-04 DIAGNOSIS — I70235 Atherosclerosis of native arteries of right leg with ulceration of other part of foot: Secondary | ICD-10-CM | POA: Diagnosis present

## 2024-12-04 MED ORDER — IOHEXOL 350 MG/ML SOLN
100.0000 mL | Freq: Once | INTRAVENOUS | Status: AC | PRN
  Administered 2024-12-04: 100 mL via INTRAVENOUS

## 2024-12-14 ENCOUNTER — Ambulatory Visit: Admitting: Podiatry

## 2024-12-14 VITALS — Ht 67.0 in | Wt 158.0 lb

## 2024-12-14 DIAGNOSIS — M2031 Hallux varus (acquired), right foot: Secondary | ICD-10-CM | POA: Diagnosis not present

## 2024-12-14 DIAGNOSIS — L84 Corns and callosities: Secondary | ICD-10-CM | POA: Diagnosis not present

## 2024-12-15 NOTE — Progress Notes (Signed)
"  °  Subjective:  Patient ID: Sean Sanders, male    DOB: 1941-03-20,  MRN: [id number]  Chief Complaint  Patient presents with   Wound Check    Rm 13 Patient is here for ulcer of the right hallux. Wound is scabbed over with no visible drainage. Pt states changing dressing every other day.    History of Present Illness Sean Sanders is an 84 year old male who presents with a wound on the right big toe.  States he has been doing well he does keep Hydrofera Blue on the area daily.  He says the area is dry.  He denies any open lesions, swelling, redness or any drainage.  Denies any fevers or chills and he has no other concerns today.      Objective:   Physical Exam General: AAO x3, NAD-wearing surgical shoe  Dermatological: On the dorsal aspect of the right hallux IPJ is a hyperkeratotic lesion.  There is no underlying ulceration, drainage or signs of infection but there is no edema or cellulitis present.  There is no fluctuation or crepitation.  There is no malodor.  Vascular: Dorsalis Pedis artery and Posterior Tibial artery pedal pulses are 1/4 bilateral with immedate capillary fill time.  There is no pain with calf compression, swelling, warmth, erythema.   Neruologic: Grossly intact via light touch bilateral.   Musculoskeletal: Hallux malleus is noted on the right hallux.     Assessment:   1. Ulcer of great toe, right, with fat layer exposed (HCC)     Plan:  Patient was evaluated and treated and all questions answered.  Assessment and Plan Assessment & Plan Chronic ulcer of right great toe - Sharply pared the hyperkeratotic lesion today and complications or bleeding.  Area is healed but is still preulcerative and he is to monitor closely.  I dispensed offloading pads and discussed he can start to gradually transition to a shoe as tolerated.  Discussed shoes with a softer toebox to avoid any pressure.  Monitor closely for any signs or symptoms of infection and/or reoccurrence of  the wound.   - Previously followed with vascular surgery  Return in about 4 weeks (around 01/11/2025) for pre-ulcerative callus.  Donnice JONELLE Fees DPM "

## 2024-12-18 NOTE — Progress Notes (Signed)
 " Office Note   HPI: Sean Sanders is a 84 y.o. (September 16, 1941) male presenting in follow-up with history of right great toe wound.    A native of [place] , he moved to [place]  for his job.  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  His wife has unfortunately passed away he continues to live independently with his dog.  His son lives in [place].  He just saw him over the Thanksgiving holiday. At his last exam, I noted that Sean Sanders appeared to struggle with some hygiene issues.  Clothes were dirty, not changing socks daily.  At the time of his last visit I also appreciated that he had nonpalpable pulses in the groin.  A CTA was ordered to further define inflow disease.  He presents today to discuss these findings and to assess the wound.  On exam, Sean Sanders was doing well.  He had no complaints.  He stated the wound continues to heal nicely.  He continues to wear silicone sleeve over the toe.  Does not change socks daily.  Has very old tennis shoes with new laces.  Denies symptoms of claudication, ischemic rest pain, tissue loss arrives today in a wheelchair because it was offered.  Usually ambulates with a cane.  Daily smoker 1 pack/day not interested in quitting ASA, statin  Past Medical History:  Diagnosis Date   Hyperlipidemia    Hypertension    Peripheral vascular disease     Past Surgical History:  Procedure Laterality Date   HERNIA REPAIR     VASECTOMY      Social History   Socioeconomic History   Marital status: Widowed    Spouse name: Not on file   Number of children: Not on file   Years of education: Not on file   Highest education level: Not on file  Occupational History   Not on file  Tobacco Use   Smoking status: Every Day    Current packs/day: 1.00    Average packs/day: 1 pack/day for 62.0 years (62.0 ttl pk-yrs)    Types: Cigarettes    Start date: 1964   Smokeless tobacco: Never   Tobacco  comments:    1 pack a day 11/05/2024 KRD  Vaping Use   Vaping status: Never Used  Substance and Sexual Activity   Alcohol use: Yes    Alcohol/week: 1.0 standard drink of alcohol    Types: 1 Cans of beer per week   Drug use: Never   Sexual activity: Not Currently  Other Topics Concern   Not on file  Social History Narrative   Not on file   Social Drivers of Health   Tobacco Use: High Risk (11/29/2024)   Patient History    Smoking Tobacco Use: Every Day    Smokeless Tobacco Use: Never    Passive Exposure: Not on file  Financial Resource Strain: Not on file  Food Insecurity: Not on file  Transportation Needs: Not on file  Physical Activity: Not on file  Stress: Not on file  Social Connections: Not on file  Intimate Partner Violence: Not on file  Depression (EYV7-0): Not on file  Alcohol Screen: Not on file  Housing: Not on file  Utilities: Not on file  Health Literacy: Not on file   No family history on file.  Current Outpatient Medications  Medication Sig Dispense Refill   amLODipine (NORVASC) 5 MG tablet      Ascorbic Acid (VITAMIN C WITH ROSE  HIPS) 500 MG tablet Take 500 mg by mouth daily.     aspirin EC 81 MG tablet Take 81 mg by mouth daily. Swallow whole.     benazepril (LOTENSIN) 10 MG tablet benazepril 10 mg tablet     cholecalciferol (VITAMIN D3) 25 MCG (1000 UNIT) tablet Take 1,000 Units by mouth daily.     cyanocobalamin 100 MCG tablet Take 100 mcg by mouth daily.     Multiple Vitamin (MULTIVITAMIN) tablet Take 1 tablet by mouth daily.     Omega-3 Fatty Acids (FISH OIL) 1000 MG CAPS Take by mouth.     rosuvastatin  (CRESTOR ) 10 MG tablet Take 1 tablet (10 mg total) by mouth daily. 90 tablet 2   No current facility-administered medications for this visit.    No Known Allergies   REVIEW OF SYSTEMS:   [X]  denotes positive finding, [ ]  denotes negative finding Cardiac  Comments:  Chest pain or chest pressure:    Shortness of breath upon exertion:     Short of breath when lying flat:    Irregular heart rhythm:        Vascular    Pain in calf, thigh, or hip brought on by ambulation:    Pain in feet at night that wakes you up from your sleep:     Blood clot in your veins:    Leg swelling:         Pulmonary    Oxygen at home:    Productive cough:     Wheezing:         Neurologic    Sudden weakness in arms or legs:     Sudden numbness in arms or legs:     Sudden onset of difficulty speaking or slurred speech:    Temporary loss of vision in one eye:     Problems with dizziness:         Gastrointestinal    Blood in stool:     Vomited blood:         Genitourinary    Burning when urinating:     Blood in urine:        Psychiatric    Major depression:         Hematologic    Bleeding problems:    Problems with blood clotting too easily:        Skin    Rashes or ulcers:        Constitutional    Fever or chills:      PHYSICAL EXAMINATION:  There were no vitals filed for this visit.  General:  WDWN in NAD; vital signs documented above Gait: Not observed HENT: WNL, normocephalic Pulmonary: normal non-labored breathing , without wheezing Cardiac: regular HR Abdomen: soft, NT, no masses Skin: without rashes Vascular Exam/Pulses:  Right Left  Radial 2+ (normal) 2+ (normal)  Ulnar    Femoral 2+ 2+ (normal)  Popliteal    DP absent absent  PT     Extremities: without ischemic changes, without Gangrene , without cellulitis; without open wounds;  Musculoskeletal: no muscle wasting or atrophy  Neurologic: A&O X 3;  No focal weakness or paresthesias are detected Psychiatric:  The pt has Normal affect. 2 x 2 mm wounds, superficial  Non-Invasive Vascular Imaging:     ABI Findings:  +---------+------------------+-----+----------+--------+  Right   Rt Pressure (mmHg)IndexWaveform  Comment   +---------+------------------+-----+----------+--------+  Brachial 136                                         +---------+------------------+-----+----------+--------+  ATA     74                0.52 monophasic          +---------+------------------+-----+----------+--------+  PTA     89                0.63 monophasic          +---------+------------------+-----+----------+--------+  Great Toe0                 0.00                     +---------+------------------+-----+----------+--------+   +---------+------------------+-----+----------+-------+  Left    Lt Pressure (mmHg)IndexWaveform  Comment  +---------+------------------+-----+----------+-------+  Brachial 141                                       +---------+------------------+-----+----------+-------+  ATA     90                0.64 monophasic         +---------+------------------+-----+----------+-------+  PTA     95                0.67 monophasic         +---------+------------------+-----+----------+-------+  Great Toe45                0.32                    +---------+------------------+-----+----------+-------+      ASSESSMENT/PLAN: Sean Sanders is a 84 y.o. male presenting in follow-up with right lower extremity critical ischemia with tissue loss at the great toe.    On physical exam, he had palpable pulses in bilateral groins.  Nonpalpable pedal pulses.  CTA was reviewed demonstrating no significant inflow disease, some level of SFA popliteal disease in the right lower extremity, however he has inline flow to the foot with what appears to be three-vessel runoff.  The wound is relatively unchanged from previous.  The eschar has been removed.  There is a small, superficial ulceration.  Sean Sanders thinks the wound is healing.  I discussed that the only thing I can offer would be lower extremity angiogram in effort to find improve distal perfusion to help with wound healing.  He was not interested in intervention at this time.  I think that the wound is multifactorial and a product of  having a hammertoe, poor hygiene, old shoes, and PAD.  We discussed that socks need to be changed daily, the wound needs to be washed, and he needs new shoes.  Being that he is not interested in intervention, I have scheduled him for 29-month follow-up with our office.  I stressed to that if the wound worsens, he needs to call me immediately as we would move to lower extremity angiogram.  He was receptive.  Asked him to continue his current medication regimen.   Fonda FORBES Rim, MD [facility] [phone]  "

## 2024-12-20 ENCOUNTER — Encounter: Payer: Self-pay | Admitting: Vascular Surgery

## 2024-12-20 ENCOUNTER — Ambulatory Visit: Attending: Vascular Surgery | Admitting: Vascular Surgery

## 2024-12-20 VITALS — BP 116/59 | HR 96 | Temp 98.4°F | Resp 20 | Ht 67.0 in | Wt 158.0 lb

## 2024-12-20 DIAGNOSIS — I70235 Atherosclerosis of native arteries of right leg with ulceration of other part of foot: Secondary | ICD-10-CM

## 2024-12-21 ENCOUNTER — Other Ambulatory Visit: Payer: Self-pay | Admitting: *Deleted

## 2024-12-21 DIAGNOSIS — I70235 Atherosclerosis of native arteries of right leg with ulceration of other part of foot: Secondary | ICD-10-CM

## 2025-01-14 ENCOUNTER — Ambulatory Visit: Admitting: Podiatry

## 2025-02-05 ENCOUNTER — Other Ambulatory Visit

## 2025-02-19 ENCOUNTER — Ambulatory Visit: Admitting: Acute Care

## 2025-06-20 ENCOUNTER — Ambulatory Visit (HOSPITAL_COMMUNITY)

## 2025-06-20 ENCOUNTER — Ambulatory Visit: Admitting: Vascular Surgery
